# Patient Record
Sex: Female | Born: 1990
Health system: Southern US, Community
[De-identification: ages and names within clinical notes are randomized; demographics above are authoritative.]

## PROBLEM LIST (undated history)

## (undated) DIAGNOSIS — R87629 Unspecified abnormal cytological findings in specimens from vagina: Secondary | ICD-10-CM

## (undated) HISTORY — DX: Unspecified abnormal cytological findings in specimens from vagina: R87.629

## (undated) HISTORY — PX: NO PAST SURGERIES: SHX2092

---

## 2015-04-26 ENCOUNTER — Ambulatory Visit (INDEPENDENT_AMBULATORY_CARE_PROVIDER_SITE_OTHER): Payer: BLUE CROSS/BLUE SHIELD | Admitting: Physician Assistant

## 2015-04-26 VITALS — BP 114/80 | HR 84 | Temp 98.5°F | Resp 18 | Ht 67.0 in | Wt 160.0 lb

## 2015-04-26 DIAGNOSIS — J020 Streptococcal pharyngitis: Secondary | ICD-10-CM

## 2015-04-26 DIAGNOSIS — J029 Acute pharyngitis, unspecified: Secondary | ICD-10-CM | POA: Diagnosis not present

## 2015-04-26 LAB — POCT RAPID STREP A (OFFICE): RAPID STREP A SCREEN: POSITIVE — AB

## 2015-04-26 MED ORDER — AMOXICILLIN 875 MG PO TABS: 875.0000 mg | ORAL_TABLET | Freq: Two times a day (BID) | ORAL | Status: AC

## 2015-04-26 MED ORDER — MAGIC MOUTHWASH W/LIDOCAINE: 10.0000 mL | ORAL | Status: DC | PRN

## 2015-04-26 NOTE — Progress Notes (Signed)
[facility] [address], [place] [id number] [phone]  Date:  04/26/2015   Name:  Kelli BoraBriana Perkins   DOB:  March 21, 1991   MRN:  [id number]  PCP:  No primary care provider on file.    Chief Complaint: Sore Throat   History of Present Illness:  This is a 24 y.o. female who is presenting with sore throat x 2 days.  Cough: no SOB/wheezing: no Nasal congestion: no Otalgia: no Sore throat: yes Fever/chills: no Aggravating/alleviating factors: took ibuprofen and helped some with pain. Hasn't taken yet today. History of asthma: no History of env allergies: no Tobacco use: no States she is prone to strep throat and gets about this time every year. No known contacts with strep. She was around her niece over the holidays who had a cold. Works at [employer].   Review of Systems:  Review of Systems See HPI  There are no active problems to display for this patient.   Prior to Admission medications   Not on File    No Known Allergies  History reviewed. No pertinent past surgical history.  Social History  Substance Use Topics  . Smoking status: Never Smoker   . Smokeless tobacco: None  . Alcohol Use: No    Family History  Problem Relation Age of Onset  . Cancer Father   . Hypertension Father     Medication list has been reviewed and updated.  Physical Examination:  Physical Exam  Constitutional: She is oriented to person, place, and time. She appears well-developed and well-nourished. No distress.  HENT:  Head: Normocephalic and atraumatic.  Right Ear: Hearing, tympanic membrane, external ear and ear canal normal.  Left Ear: Hearing, tympanic membrane, external ear and ear canal normal.  Nose: Nose normal.  Mouth/Throat: Uvula is midline and mucous membranes are normal. Oropharyngeal exudate (left tonsil), posterior oropharyngeal edema and posterior oropharyngeal erythema present. No tonsillar abscesses.  Eyes: Conjunctivae  and lids are normal. Right eye exhibits no discharge. Left eye exhibits no discharge. No scleral icterus.  Cardiovascular: Normal rate, regular rhythm, normal heart sounds and normal pulses.   No murmur heard. Pulmonary/Chest: Effort normal and breath sounds normal. No respiratory distress. She has no wheezes. She has no rhonchi. She has no rales.  Musculoskeletal: Normal range of motion.  Lymphadenopathy:       Head (right side): No submental, no submandibular and no tonsillar adenopathy present.       Head (left side): No submental, no submandibular and no tonsillar adenopathy present.    She has no cervical adenopathy.  Neurological: She is alert and oriented to person, place, and time.  Skin: Skin is warm, dry and intact. No lesion and no rash noted.  Psychiatric: She has a normal mood and affect. Her speech is normal and behavior is normal. Thought content normal.   BP 114/80 mmHg  Pulse 84  Temp(Src) 98.5 F (36.9 C) (Oral)  Resp 18  Ht 5\' 7"  (1.702 m)  Wt 160 lb (72.576 kg)  BMI 25.05 kg/m2  SpO2 98%  LMP 03/15/2015  Results for orders placed or performed in visit on 04/26/15  POCT rapid strep A  Result Value Ref Range   Rapid Strep A Screen Positive (A) Negative    Assessment and Plan:  1. Streptococcal sore throat 2. Sore throat Rapid strep positive. Amox BID x 10 days. Magic mouthwash for pain. Return if not significantly better in 72 hours. - magic mouthwash w/lidocaine  SOLN; Take 10 mLs by mouth every 2 (two) hours as needed for mouth pain.  Dispense: 360 mL; Refill: 0 - amoxicillin (AMOXIL) 875 MG tablet; Take 1 tablet (875 mg total) by mouth 2 (two) times daily.  Dispense: 20 tablet; Refill: 0 - POCT rapid strep A   Kelli Perkins. Kelli Perkins, MHS [facility]  04/26/2015

## 2015-04-26 NOTE — Patient Instructions (Signed)
Take antibiotic twice a day for 10 days. Mouthwash every couple hours as needed, gargle but do not swallow. Ibuprofen/tylenol for pain. Return if symptoms not significantly better in 3 days.

## 2015-04-29 NOTE — Addendum Note (Signed)
Addended by: Carmelina DaneANDERSON, Seham Gardenhire S on: 04/29/2015 05:22 PM   Modules accepted: Kipp BroodSmartSet

## 2015-04-29 NOTE — Progress Notes (Signed)
  Medical screening examination/treatment/procedure(s) were performed by non-physician practitioner and as supervising physician I was immediately available for consultation/collaboration.     

## 2017-08-15 ENCOUNTER — Encounter: Payer: Self-pay | Admitting: Family Medicine

## 2017-08-15 ENCOUNTER — Ambulatory Visit (INDEPENDENT_AMBULATORY_CARE_PROVIDER_SITE_OTHER): Payer: Self-pay | Admitting: General Practice

## 2017-08-15 DIAGNOSIS — Z3201 Encounter for pregnancy test, result positive: Secondary | ICD-10-CM

## 2017-08-15 LAB — POCT PREGNANCY, URINE: Preg Test, Ur: POSITIVE — AB

## 2017-08-15 NOTE — Progress Notes (Signed)
I reviewed the note and agree with the nursing assessment and plan.   Bryar Dahms, CNM 07/27/2017 10:32 AM   

## 2017-08-15 NOTE — Progress Notes (Signed)
Patient here for upt today. UPT +. Patient reports first positive home test yesterday. LMP 07/13/17 EDD 04/19/18 [id number]. Patient denies taking any meds only multivitamin. [facility]. Recommended she follow up with them & begin PNV. Patient verbalized understanding and asked if lifting 15lbs at her job was okay. Told patient that is fine. Patient verbalized understanding & had no questions.

## 2017-08-16 DIAGNOSIS — Z3A01 Less than 8 weeks gestation of pregnancy: Secondary | ICD-10-CM | POA: Diagnosis not present

## 2017-08-16 DIAGNOSIS — O26851 Spotting complicating pregnancy, first trimester: Secondary | ICD-10-CM | POA: Diagnosis not present

## 2017-08-20 DIAGNOSIS — Z3A01 Less than 8 weeks gestation of pregnancy: Secondary | ICD-10-CM | POA: Diagnosis not present

## 2017-08-20 DIAGNOSIS — O26851 Spotting complicating pregnancy, first trimester: Secondary | ICD-10-CM | POA: Diagnosis not present

## 2017-08-20 DIAGNOSIS — O26859 Spotting complicating pregnancy, unspecified trimester: Secondary | ICD-10-CM | POA: Diagnosis not present

## 2017-08-20 DIAGNOSIS — O200 Threatened abortion: Secondary | ICD-10-CM | POA: Diagnosis not present

## 2017-08-20 DIAGNOSIS — Z6829 Body mass index (BMI) 29.0-29.9, adult: Secondary | ICD-10-CM | POA: Diagnosis not present

## 2017-08-22 DIAGNOSIS — O021 Missed abortion: Secondary | ICD-10-CM | POA: Diagnosis not present

## 2017-08-31 DIAGNOSIS — O021 Missed abortion: Secondary | ICD-10-CM | POA: Diagnosis not present

## 2017-11-27 DIAGNOSIS — R87610 Atypical squamous cells of undetermined significance on cytologic smear of cervix (ASC-US): Secondary | ICD-10-CM | POA: Diagnosis not present

## 2017-11-27 DIAGNOSIS — Z113 Encounter for screening for infections with a predominantly sexual mode of transmission: Secondary | ICD-10-CM | POA: Diagnosis not present

## 2017-11-27 DIAGNOSIS — Z01419 Encounter for gynecological examination (general) (routine) without abnormal findings: Secondary | ICD-10-CM | POA: Diagnosis not present

## 2017-11-27 DIAGNOSIS — Z01411 Encounter for gynecological examination (general) (routine) with abnormal findings: Secondary | ICD-10-CM | POA: Diagnosis not present

## 2017-11-27 DIAGNOSIS — Z114 Encounter for screening for human immunodeficiency virus [HIV]: Secondary | ICD-10-CM | POA: Diagnosis not present

## 2018-11-12 DIAGNOSIS — Z03818 Encounter for observation for suspected exposure to other biological agents ruled out: Secondary | ICD-10-CM | POA: Diagnosis not present

## 2018-11-19 DIAGNOSIS — Z3687 Encounter for antenatal screening for uncertain dates: Secondary | ICD-10-CM | POA: Diagnosis not present

## 2018-11-19 DIAGNOSIS — Z3200 Encounter for pregnancy test, result unknown: Secondary | ICD-10-CM | POA: Diagnosis not present

## 2018-11-21 DIAGNOSIS — Z3687 Encounter for antenatal screening for uncertain dates: Secondary | ICD-10-CM | POA: Diagnosis not present

## 2018-11-23 DIAGNOSIS — Z20828 Contact with and (suspected) exposure to other viral communicable diseases: Secondary | ICD-10-CM | POA: Diagnosis not present

## 2018-11-25 DIAGNOSIS — Z3687 Encounter for antenatal screening for uncertain dates: Secondary | ICD-10-CM | POA: Diagnosis not present

## 2018-11-29 ENCOUNTER — Encounter (HOSPITAL_COMMUNITY): Payer: Self-pay | Admitting: Emergency Medicine

## 2018-11-29 ENCOUNTER — Emergency Department (HOSPITAL_COMMUNITY)
Admission: EM | Admit: 2018-11-29 | Discharge: 2018-11-29 | Disposition: A | Discharge status: Home / Self Care | Payer: BC Managed Care – PPO | Attending: Emergency Medicine | Admitting: Emergency Medicine

## 2018-11-29 ENCOUNTER — Other Ambulatory Visit: Payer: Self-pay

## 2018-11-29 DIAGNOSIS — N939 Abnormal uterine and vaginal bleeding, unspecified: Secondary | ICD-10-CM | POA: Insufficient documentation

## 2018-11-29 DIAGNOSIS — Z3A01 Less than 8 weeks gestation of pregnancy: Secondary | ICD-10-CM | POA: Diagnosis not present

## 2018-11-29 DIAGNOSIS — O209 Hemorrhage in early pregnancy, unspecified: Secondary | ICD-10-CM | POA: Diagnosis not present

## 2018-11-29 DIAGNOSIS — Z5321 Procedure and treatment not carried out due to patient leaving prior to being seen by health care provider: Secondary | ICD-10-CM | POA: Diagnosis not present

## 2018-11-29 DIAGNOSIS — N898 Other specified noninflammatory disorders of vagina: Secondary | ICD-10-CM | POA: Diagnosis not present

## 2018-11-29 NOTE — ED Notes (Signed)
Called patient in lobby to collect labs and no one responded

## 2018-12-13 DIAGNOSIS — Z3201 Encounter for pregnancy test, result positive: Secondary | ICD-10-CM | POA: Diagnosis not present

## 2018-12-20 DIAGNOSIS — Z3481 Encounter for supervision of other normal pregnancy, first trimester: Secondary | ICD-10-CM | POA: Diagnosis not present

## 2018-12-20 LAB — OB RESULTS CONSOLE GC/CHLAMYDIA
Chlamydia: NEGATIVE
Gonorrhea: NEGATIVE

## 2018-12-20 LAB — OB RESULTS CONSOLE RUBELLA ANTIBODY, IGM: Rubella: IMMUNE

## 2018-12-20 LAB — OB RESULTS CONSOLE HEPATITIS B SURFACE ANTIGEN: Hepatitis B Surface Ag: NEGATIVE

## 2018-12-20 LAB — OB RESULTS CONSOLE HIV ANTIBODY (ROUTINE TESTING): HIV: NONREACTIVE

## 2018-12-20 LAB — OB RESULTS CONSOLE RPR: RPR: NONREACTIVE

## 2018-12-25 ENCOUNTER — Other Ambulatory Visit: Payer: Self-pay

## 2018-12-25 ENCOUNTER — Inpatient Hospital Stay (HOSPITAL_COMMUNITY)
Admission: AD | Admit: 2018-12-25 | Discharge: 2018-12-25 | Disposition: A | Discharge status: Home / Self Care | Payer: BC Managed Care – PPO | Attending: Obstetrics & Gynecology | Admitting: Obstetrics & Gynecology

## 2018-12-25 ENCOUNTER — Encounter (HOSPITAL_COMMUNITY): Payer: Self-pay

## 2018-12-25 ENCOUNTER — Inpatient Hospital Stay (HOSPITAL_COMMUNITY): Payer: BC Managed Care – PPO

## 2018-12-25 DIAGNOSIS — O209 Hemorrhage in early pregnancy, unspecified: Secondary | ICD-10-CM | POA: Diagnosis not present

## 2018-12-25 DIAGNOSIS — O208 Other hemorrhage in early pregnancy: Secondary | ICD-10-CM

## 2018-12-25 DIAGNOSIS — Z3A09 9 weeks gestation of pregnancy: Secondary | ICD-10-CM | POA: Diagnosis not present

## 2018-12-25 DIAGNOSIS — O26859 Spotting complicating pregnancy, unspecified trimester: Secondary | ICD-10-CM | POA: Diagnosis not present

## 2018-12-25 DIAGNOSIS — Z79899 Other long term (current) drug therapy: Secondary | ICD-10-CM | POA: Diagnosis not present

## 2018-12-25 LAB — URINALYSIS, ROUTINE W REFLEX MICROSCOPIC
Bilirubin Urine: NEGATIVE
Glucose, UA: NEGATIVE mg/dL
Hgb urine dipstick: NEGATIVE
Ketones, ur: 5 mg/dL — AB
Leukocytes,Ua: NEGATIVE
Nitrite: NEGATIVE
Protein, ur: 30 mg/dL — AB
Specific Gravity, Urine: 1.03 (ref 1.005–1.030)
pH: 5 (ref 5.0–8.0)

## 2018-12-25 LAB — WET PREP, GENITAL
Clue Cells Wet Prep HPF POC: NONE SEEN
Sperm: NONE SEEN
Trich, Wet Prep: NONE SEEN
Yeast Wet Prep HPF POC: NONE SEEN

## 2018-12-25 LAB — ABO/RH: ABO/RH(D): O POS

## 2018-12-25 LAB — POCT PREGNANCY, URINE: Preg Test, Ur: POSITIVE — AB

## 2018-12-25 NOTE — MAU Note (Signed)
Got off work at [employer] and felt a gush of fluid so she went to the restroom and noticed it was blood.  Reports she is [redacted]w[redacted]d pregnancy.  LMP 10/19/18.  States she was seen at [facility] and had an u/s at 8 wk and FHR was seen.

## 2018-12-25 NOTE — MAU Provider Note (Signed)
History     CSN: [id number]  Arrival date and time: 12/25/18 2021   First Provider Initiated Contact with Patient 12/25/18 2054      Chief Complaint  Patient presents with  . Vaginal Bleeding   Kelli Perkins is a 28 y.o. G2P0010 at 2344w4d who [facility].  She presents today for Vaginal Bleeding that she noticed about one hour ago.  She states she went to the bathroom, after feeling a gush, and noticed bright red blood in her underwear.  She denies clots and pain or abdominal cramping.  She reports that she was working prior to the incident and does at lot of standing at her job.  Patient reports that she has an US last week that showed a SIUP at approximately 8 weeks.       OB History    Gravida  2   Para      Term      Preterm      AB  1   Living        SAB  1   TAB      Ectopic      Multiple      Live Births              History reviewed. No pertinent past medical history.  History reviewed. No pertinent surgical history.  Family History  Problem Relation Age of Onset  . Cancer Father   . Hypertension Father     Social History   Tobacco Use  . Smoking status: Never Smoker  Substance Use Topics  . Alcohol use: No    Alcohol/week: 0.0 standard drinks  . Drug use: No    Allergies: No Known Allergies  Medications Prior to Admission  Medication Sig Dispense Refill Last Dose  . Doxylamine-Pyridoxine 10-10 MG TBEC Take by mouth.   12/25/2018 at Unknown time  . Prenatal Vit-Fe Fumarate-FA (PRENATAL MULTIVITAMIN) TABS tablet Take 1 tablet by mouth daily at 12 noon.   12/25/2018 at Unknown time  . magic mouthwash w/lidocaine SOLN Take 10 mLs by mouth every 2 (two) hours as needed for mouth pain. 360 mL 0     Review of Systems  Constitutional: Negative for chills and fever.  Respiratory: Negative for cough and shortness of breath.   Gastrointestinal: Positive for constipation (2 days ago; Hard to pass), nausea and vomiting.  Negative for abdominal pain and diarrhea.  Genitourinary: Positive for vaginal bleeding. Negative for difficulty urinating, dysuria and vaginal discharge.  Neurological: Negative for dizziness, light-headedness and headaches.   Physical Exam   Blood pressure 124/79, pulse 99, temperature 98.7 F (37.1 C), resp. rate 17, weight 88.2 kg, last menstrual period 10/19/2018.  Physical Exam  Constitutional: She is oriented to person, place, and time. She appears well-developed and well-nourished.  HENT:  Head: Normocephalic and atraumatic.  Eyes: Conjunctivae are normal.  Neck: Normal range of motion.  Cardiovascular: Normal rate, regular rhythm and normal heart sounds.  Respiratory: Effort normal and breath sounds normal.  GI: Soft. Bowel sounds are normal.  Genitourinary: Cervix exhibits no motion tenderness and no discharge.    Vaginal bleeding present.     No vaginal discharge.  There is bleeding in the vagina.    Genitourinary Comments:  Speculum Exam: -Normal External Genitalia: Non tender, no apparent discharge or blood at introitus.  -Vaginal Vault: Pink mucosa with good rugae. Scant amt blood noted with quarter sized clot removed from posterior fornix  -wet prep  collected -Cervix:Pink, no lesions, cysts, or polyps.  Appears closed. No active bleeding or discharge from os-GC/CT collected -Bimanual Exam:  No tenderness in cul de sac Uterus difficult to assess.    Musculoskeletal: Normal range of motion.        General: No edema.  Neurological: She is alert and oriented to person, place, and time.  Skin: Skin is warm and dry.  Psychiatric: She has a normal mood and affect. Her behavior is normal.    MAU Course  Procedures Results for orders placed or performed during the hospital encounter of 12/25/18 (from the past 24 hour(s))  Pregnancy, urine POC     Status: Abnormal   Collection Time: 12/25/18  8:39 PM  Result Value Ref Range   Preg Test, Ur POSITIVE (A) NEGATIVE     MDM Pelvic Exam; Wet Prep and GC/CT Labs: UA, UPT, ABO/RH Ultrasound Assessment and Plan  28 year old G2P0010 SIUP at 9.4 weeks Vaginal Bleeding  -Exam findings discussed. -Patient expresses concern about vaginal bleeding and reassured that will evaluate accordingly. -Cultures collected. -Will send for [place]. -Results pending.   Maryann Conners MSN, CNM 12/25/2018, 8:55 PM   Reassessment (10:07 PM) SIUP at 9.1 weeks No SCH  -[place] results discussed with patient who was ecstatic! -Patient expresses gratitude and relief. -Discussed normalcy of some vaginal bleeding in first trimester. -Reviewed wet prep and informed that GC/CT pending.  -No Blood type on file.  Patient suspects she is O positive, but is unsure.  Will collect immediately.  Reassessment (10:48 PM) O Positive  -Nurse instructed to inform patient of blood type. -Encouraged to call or return to MAU if symptoms worsen or with the onset of new symptoms. -Discharged to home in stable condition.  Maryann Conners MSN, CNM 12/25/2018 10:54 PM

## 2018-12-25 NOTE — Discharge Instructions (Signed)
First Trimester of Pregnancy °The first trimester of pregnancy is from week 1 until the end of week 13 (months 1 through 3). A week after a sperm fertilizes an egg, the egg will implant on the wall of the uterus. This embryo will begin to develop into a baby. Genes from you and your partner will form the baby. The female genes will determine whether the baby will be a boy or a girl. At 6-8 weeks, the eyes and face will be formed, and the heartbeat can be seen on ultrasound. At the end of 12 weeks, all the baby's organs will be formed. °Now that you are pregnant, you will want to do everything you can to have a healthy baby. Two of the most important things are to get good prenatal care and to follow your health care provider's instructions. Prenatal care is all the medical care you receive before the baby's birth. This care will help prevent, find, and treat any problems during the pregnancy and childbirth. °Body changes during your first trimester °Your body goes through many changes during pregnancy. The changes vary from woman to woman. °· You may gain or lose a couple of pounds at first. °· You may feel sick to your stomach (nauseous) and you may throw up (vomit). If the vomiting is uncontrollable, call your health care provider. °· You may tire easily. °· You may develop headaches that can be relieved by medicines. All medicines should be approved by your health care provider. °· You may urinate more often. Painful urination may mean you have a bladder infection. °· You may develop heartburn as a result of your pregnancy. °· You may develop constipation because certain hormones are causing the muscles that push stool through your intestines to slow down. °· You may develop hemorrhoids or swollen veins (varicose veins). °· Your breasts may begin to grow larger and become tender. Your nipples may stick out more, and the tissue that surrounds them (areola) may become darker. °· Your gums may bleed and may be  sensitive to brushing and flossing. °· Dark spots or blotches (chloasma, mask of pregnancy) may develop on your face. This will likely fade after the baby is born. °· Your menstrual periods will stop. °· You may have a loss of appetite. °· You may develop cravings for certain kinds of food. °· You may have changes in your emotions from day to day, such as being excited to be pregnant or being concerned that something may go wrong with the pregnancy and baby. °· You may have more vivid and strange dreams. °· You may have changes in your hair. These can include thickening of your hair, rapid growth, and changes in texture. Some women also have hair loss during or after pregnancy, or hair that feels dry or thin. Your hair will most likely return to normal after your baby is born. °What to expect at prenatal visits °During a routine prenatal visit: °· You will be weighed to make sure you and the baby are growing normally. °· Your blood pressure will be taken. °· Your abdomen will be measured to track your baby's growth. °· The fetal heartbeat will be listened to between weeks 10 and 14 of your pregnancy. °· Test results from any previous visits will be discussed. °Your health care provider may ask you: °· How you are feeling. °· If you are feeling the baby move. °· If you have had any abnormal symptoms, such as leaking fluid, bleeding, severe headaches, or abdominal   cramping. °· If you are using any tobacco products, including cigarettes, chewing tobacco, and electronic cigarettes. °· If you have any questions. °Other tests that may be performed during your first trimester include: °· Blood tests to find your blood type and to check for the presence of any previous infections. The tests will also be used to check for low iron levels (anemia) and protein on red blood cells (Rh antibodies). Depending on your risk factors, or if you previously had diabetes during pregnancy, you may have tests to check for high blood sugar  that affects pregnant women (gestational diabetes). °· Urine tests to check for infections, diabetes, or protein in the urine. °· An ultrasound to confirm the proper growth and development of the baby. °· Fetal screens for spinal cord problems (spina bifida) and Down syndrome. °· HIV (human immunodeficiency virus) testing. Routine prenatal testing includes screening for HIV, unless you choose not to have this test. °· You may need other tests to make sure you and the baby are doing well. °Follow these instructions at home: °Medicines °· Follow your health care provider's instructions regarding medicine use. Specific medicines may be either safe or unsafe to take during pregnancy. °· Take a prenatal vitamin that contains at least 600 micrograms (mcg) of folic acid. °· If you develop constipation, try taking a stool softener if your health care provider approves. °Eating and drinking ° °· Eat a balanced diet that includes fresh fruits and vegetables, whole grains, good sources of protein such as meat, eggs, or tofu, and low-fat dairy. Your health care provider will help you determine the amount of weight gain that is right for you. °· Avoid raw meat and uncooked cheese. These carry germs that can cause birth defects in the baby. °· Eating four or five small meals rather than three large meals a day may help relieve nausea and vomiting. If you start to feel nauseous, eating a few soda crackers can be helpful. Drinking liquids between meals, instead of during meals, also seems to help ease nausea and vomiting. °· Limit foods that are high in fat and processed sugars, such as fried and sweet foods. °· To prevent constipation: °? Eat foods that are high in fiber, such as fresh fruits and vegetables, whole grains, and beans. °? Drink enough fluid to keep your urine clear or pale yellow. °Activity °· Exercise only as directed by your health care provider. Most women can continue their usual exercise routine during  pregnancy. Try to exercise for 30 minutes at least 5 days a week. Exercising will help you: °? Control your weight. °? Stay in shape. °? Be prepared for labor and delivery. °· Experiencing pain or cramping in the lower abdomen or lower back is a good sign that you should stop exercising. Check with your health care provider before continuing with normal exercises. °· Try to avoid standing for long periods of time. Move your legs often if you must stand in one place for a long time. °· Avoid heavy lifting. °· Wear low-heeled shoes and practice good posture. °· You may continue to have sex unless your health care provider tells you not to. °Relieving pain and discomfort °· Wear a good support bra to relieve breast tenderness. °· Take warm sitz baths to soothe any pain or discomfort caused by hemorrhoids. Use hemorrhoid cream if your health care provider approves. °· Rest with your legs elevated if you have leg cramps or low back pain. °· If you develop varicose veins in   your legs, wear support hose. Elevate your feet for 15 minutes, 3-4 times a day. Limit salt in your diet. Prenatal care  Schedule your prenatal visits by the twelfth week of pregnancy. They are usually scheduled monthly at first, then more often in the last 2 months before delivery.  Write down your questions. Take them to your prenatal visits.  Keep all your prenatal visits as told by your health care provider. This is important. Safety  Wear your seat belt at all times when driving.  Make a list of emergency phone numbers, including numbers for family, friends, the hospital, and police and fire departments. General instructions  Ask your health care provider for a referral to a local prenatal education class. Begin classes no later than the beginning of month 6 of your pregnancy.  Ask for help if you have counseling or nutritional needs during pregnancy. Your health care provider can offer advice or refer you to specialists for help  with various needs.  Do not use hot tubs, steam rooms, or saunas.  Do not douche or use tampons or scented sanitary pads.  Do not cross your legs for long periods of time.  Avoid cat litter boxes and soil used by cats. These carry germs that can cause birth defects in the baby and possibly loss of the fetus by miscarriage or stillbirth.  Avoid all smoking, herbs, alcohol, and medicines not prescribed by your health care provider. Chemicals in these products affect the formation and growth of the baby.  Do not use any products that contain nicotine or tobacco, such as cigarettes and e-cigarettes. If you need help quitting, ask your health care provider. You may receive counseling support and other resources to help you quit.  Schedule a dentist appointment. At home, brush your teeth with a soft toothbrush and be gentle when you floss. Contact a health care provider if:  You have dizziness.  You have mild pelvic cramps, pelvic pressure, or nagging pain in the abdominal area.  You have persistent nausea, vomiting, or diarrhea.  You have a bad smelling vaginal discharge.  You have pain when you urinate.  You notice increased swelling in your face, hands, legs, or ankles.  You are exposed to fifth disease or chickenpox.  You are exposed to [place] measles (rubella) and have never had it. Get help right away if:  You have a fever.  You are leaking fluid from your vagina.  You have spotting or bleeding from your vagina.  You have severe abdominal cramping or pain.  You have rapid weight gain or loss.  You vomit blood or material that looks like coffee grounds.  You develop a severe headache.  You have shortness of breath.  You have any kind of trauma, such as from a fall or a car accident. Summary  The first trimester of pregnancy is from week 1 until the end of week 13 (months 1 through 3).  Your body goes through many changes during pregnancy. The changes vary from  woman to woman.  You will have routine prenatal visits. During those visits, your health care provider will examine you, discuss any test results you may have, and talk with you about how you are feeling. This information is not intended to replace advice given to you by your health care provider. Make sure you discuss any questions you have with your health care provider. Document Released: 04/11/2001 Document Revised: 03/30/2017 Document Reviewed: 03/29/2016 Elsevier Patient Education  [place]. Vaginal Bleeding During  Pregnancy, First Trimester  A small amount of bleeding from the vagina (spotting) is relatively common during early pregnancy. It usually stops on its own. Various things may cause bleeding or spotting during early pregnancy. Some bleeding may be related to the pregnancy, and some may not. In many cases, the bleeding is normal and is not a problem. However, bleeding can also be a sign of something serious. Be sure to tell your health care provider about any vaginal bleeding right away. Some possible causes of vaginal bleeding during the first trimester include:  Infection or inflammation of the cervix.  Growths (polyps) on the cervix.  Miscarriage or threatened miscarriage.  Pregnancy tissue developing outside of the uterus (ectopic pregnancy).  A mass of tissue developing in the uterus due to an egg being fertilized incorrectly (molar pregnancy). Follow these instructions at home: Activity  Follow instructions from your health care provider about limiting your activity. Ask what activities are safe for you.  If needed, make plans for someone to help with your regular activities.  Do not have sex or orgasms until your health care provider says that this is safe. General instructions  Take over-the-counter and prescription medicines only as told by your health care provider.  Pay attention to any changes in your symptoms.  Do not use tampons or  douche.  Write down how many pads you use each day, how often you change pads, and how soaked (saturated) they are.  If you pass any tissue from your vagina, save the tissue so you can show it to your health care provider.  Keep all follow-up visits as told by your health care provider. This is important. Contact a health care provider if:  You have vaginal bleeding during any part of your pregnancy.  You have cramps or labor pains.  You have a fever. Get help right away if:  You have severe cramps in your back or abdomen.  You pass large clots or a large amount of tissue from your vagina.  Your bleeding increases.  You feel light-headed or weak, or you faint.  You have chills.  You are leaking fluid or have a gush of fluid from your vagina. Summary  A small amount of bleeding (spotting) from the vagina is relatively common during early pregnancy.  Various things may cause bleeding or spotting in early pregnancy.  Be sure to tell your health care provider about any vaginal bleeding right away. This information is not intended to replace advice given to you by your health care provider. Make sure you discuss any questions you have with your health care provider. Document Released: 01/25/2005 Document Revised: 08/06/2018 Document Reviewed: 07/20/2016 Elsevier Patient Education  2020 [employer]

## 2018-12-27 DIAGNOSIS — Z3481 Encounter for supervision of other normal pregnancy, first trimester: Secondary | ICD-10-CM | POA: Diagnosis not present

## 2018-12-27 DIAGNOSIS — Z3A09 9 weeks gestation of pregnancy: Secondary | ICD-10-CM | POA: Diagnosis not present

## 2018-12-27 DIAGNOSIS — O209 Hemorrhage in early pregnancy, unspecified: Secondary | ICD-10-CM | POA: Diagnosis not present

## 2018-12-27 DIAGNOSIS — Z3682 Encounter for antenatal screening for nuchal translucency: Secondary | ICD-10-CM | POA: Diagnosis not present

## 2018-12-27 LAB — GC/CHLAMYDIA PROBE AMP (~~LOC~~) NOT AT ARMC
Chlamydia: NEGATIVE
Neisseria Gonorrhea: NEGATIVE

## 2018-12-31 DIAGNOSIS — O209 Hemorrhage in early pregnancy, unspecified: Secondary | ICD-10-CM | POA: Diagnosis not present

## 2018-12-31 DIAGNOSIS — Z3A10 10 weeks gestation of pregnancy: Secondary | ICD-10-CM | POA: Diagnosis not present

## 2019-01-09 DIAGNOSIS — O209 Hemorrhage in early pregnancy, unspecified: Secondary | ICD-10-CM | POA: Diagnosis not present

## 2019-01-09 DIAGNOSIS — Z3A11 11 weeks gestation of pregnancy: Secondary | ICD-10-CM | POA: Diagnosis not present

## 2019-01-17 DIAGNOSIS — Z3A12 12 weeks gestation of pregnancy: Secondary | ICD-10-CM | POA: Diagnosis not present

## 2019-01-17 DIAGNOSIS — O209 Hemorrhage in early pregnancy, unspecified: Secondary | ICD-10-CM | POA: Diagnosis not present

## 2019-01-17 DIAGNOSIS — Z3682 Encounter for antenatal screening for nuchal translucency: Secondary | ICD-10-CM | POA: Diagnosis not present

## 2019-01-31 DIAGNOSIS — O26872 Cervical shortening, second trimester: Secondary | ICD-10-CM | POA: Diagnosis not present

## 2019-01-31 DIAGNOSIS — O09292 Supervision of pregnancy with other poor reproductive or obstetric history, second trimester: Secondary | ICD-10-CM | POA: Diagnosis not present

## 2019-01-31 DIAGNOSIS — Z3A14 14 weeks gestation of pregnancy: Secondary | ICD-10-CM | POA: Diagnosis not present

## 2019-02-04 DIAGNOSIS — Z03818 Encounter for observation for suspected exposure to other biological agents ruled out: Secondary | ICD-10-CM | POA: Diagnosis not present

## 2019-02-04 DIAGNOSIS — Z20828 Contact with and (suspected) exposure to other viral communicable diseases: Secondary | ICD-10-CM | POA: Diagnosis not present

## 2019-02-14 DIAGNOSIS — Z361 Encounter for antenatal screening for raised alphafetoprotein level: Secondary | ICD-10-CM | POA: Diagnosis not present

## 2019-02-14 DIAGNOSIS — O26872 Cervical shortening, second trimester: Secondary | ICD-10-CM | POA: Diagnosis not present

## 2019-02-14 DIAGNOSIS — O09292 Supervision of pregnancy with other poor reproductive or obstetric history, second trimester: Secondary | ICD-10-CM | POA: Diagnosis not present

## 2019-02-14 DIAGNOSIS — Z3A16 16 weeks gestation of pregnancy: Secondary | ICD-10-CM | POA: Diagnosis not present

## 2019-02-19 DIAGNOSIS — O24410 Gestational diabetes mellitus in pregnancy, diet controlled: Secondary | ICD-10-CM | POA: Diagnosis not present

## 2019-02-19 DIAGNOSIS — O3432 Maternal care for cervical incompetence, second trimester: Secondary | ICD-10-CM | POA: Diagnosis not present

## 2019-02-19 DIAGNOSIS — Z3A17 17 weeks gestation of pregnancy: Secondary | ICD-10-CM | POA: Diagnosis not present

## 2019-02-19 DIAGNOSIS — O26872 Cervical shortening, second trimester: Secondary | ICD-10-CM | POA: Diagnosis not present

## 2019-02-19 DIAGNOSIS — O09292 Supervision of pregnancy with other poor reproductive or obstetric history, second trimester: Secondary | ICD-10-CM | POA: Diagnosis not present

## 2019-02-21 ENCOUNTER — Other Ambulatory Visit (HOSPITAL_COMMUNITY): Payer: Self-pay | Admitting: Obstetrics and Gynecology

## 2019-02-21 DIAGNOSIS — Z3A18 18 weeks gestation of pregnancy: Secondary | ICD-10-CM

## 2019-02-21 DIAGNOSIS — Z3686 Encounter for antenatal screening for cervical length: Secondary | ICD-10-CM

## 2019-02-24 DIAGNOSIS — Z20828 Contact with and (suspected) exposure to other viral communicable diseases: Secondary | ICD-10-CM | POA: Diagnosis not present

## 2019-02-26 ENCOUNTER — Other Ambulatory Visit: Payer: Self-pay

## 2019-02-26 ENCOUNTER — Encounter (HOSPITAL_COMMUNITY): Payer: Self-pay | Admitting: *Deleted

## 2019-02-26 ENCOUNTER — Ambulatory Visit (HOSPITAL_COMMUNITY)
Admission: RE | Admit: 2019-02-26 | Discharge: 2019-02-26 | Disposition: A | Discharge status: Home / Self Care | Payer: BC Managed Care – PPO | Source: Ambulatory Visit | Attending: Obstetrics and Gynecology | Admitting: Obstetrics and Gynecology

## 2019-02-26 ENCOUNTER — Ambulatory Visit (HOSPITAL_BASED_OUTPATIENT_CLINIC_OR_DEPARTMENT_OTHER): Payer: BC Managed Care – PPO | Admitting: *Deleted

## 2019-02-26 VITALS — BP 127/77 | HR 91 | Temp 98.7°F

## 2019-02-26 DIAGNOSIS — O26872 Cervical shortening, second trimester: Secondary | ICD-10-CM

## 2019-02-26 DIAGNOSIS — Z3A18 18 weeks gestation of pregnancy: Secondary | ICD-10-CM

## 2019-02-26 DIAGNOSIS — O26879 Cervical shortening, unspecified trimester: Secondary | ICD-10-CM | POA: Diagnosis not present

## 2019-02-26 DIAGNOSIS — Z3686 Encounter for antenatal screening for cervical length: Secondary | ICD-10-CM | POA: Diagnosis not present

## 2019-02-26 DIAGNOSIS — O3432 Maternal care for cervical incompetence, second trimester: Secondary | ICD-10-CM | POA: Diagnosis not present

## 2019-02-27 NOTE — Progress Notes (Signed)
This patient was seen in consultation due to a shortened cervix with funneling noted on a recent ultrasound performed in your office.  The patient reports one prior miscarriage at around 6 weeks.  She denies any history of a prior preterm birth.  She also denies any prior surgeries to her cervix.  She reports that a digital exam performed in your office revealed that her cervix is firm and closed.  She denies feeling any lower abdominal cramping or contractions.  Due to the sonographically detected shortened cervix, the patient reports that she is currently treated with daily vaginal progesterone.  A viable intrauterine pregnancy was noted today.    A transvaginal ultrasound performed today revealed a cervical length of 1.36 cm long with funneling. There still appears to be a decent amount of cervical tissue surrounding the funneled cervix.  The increased risk of a preterm birth due to her shortened and funneled cervix was discussed with the patient.  She was advised that as she does not have a history of a prior preterm birth, an incidentally detected shortened cervix on a second trimester ultrasound is not an indication for a cervical cerclage.  The recommended treatment for asymptomatic women without a prior preterm birth with a sonographically detected shortened cervix is daily vaginal progesterone.  She was advised that vaginal progesterone has been shown to decrease the risk of a preterm birth in a woman with a sonographically detected shortened cervix.    The patient was advised to continue using the Prometrium that has been prescribed for her.  The patient stated that she is concerned that she may not be putting the Prometrium tablets high enough into her vagina as there is no applicator.  She was advised to contact her insurance company to determine if Crinone 8% gel, which has an applicator would be covered.  If Crinone 8% gel is covered, she may be switched over to that form of vaginal  progesterone.  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  Due to her sonographically detected shortened cervix, in an attempt to try to improve her pregnancy outcome, I would recommend that she be taken out of work and be placed on home modified bedrest until she reaches a more optimal gestational age.   The patient should continue to be followed with serial cervical length measurements.  Should progressive cervical shortening be noted during her future exams, prolonged hospitalization until she reaches a more optimal gestational age may be considered.  She understands that due to her sonographically detected shortened cervix, that despite all medical treatment, she still may have an unsuccessful pregnancy outcome.  The patient stated that all of her questions had been answered to her complete satisfaction.  Thank you for referring this very nice patient for a Maternal-Fetal Medicine consultation.  A total of 30 minutes was spent counseling and coordinating the care for this patient.  Greater than 50% of the time was spent in direct face-to-face contact.  Addendum:02/27/2019 I received a call from the nurse in your office who stated that the patient called this morning asking that a cervical cerclage be placed for her.  Although I had advised the patient to be treated with daily vaginal progesterone during our consultation yesterday, I do not believe that the cerclage would do any harm.  Therefore, if the patient wants a cerclage, she may have one placed.  As Dr. Ronita Hipps is unavailable this week,  the patient may have the cervical cerclage placed sometime next week.  The risks versus benefits of a cerclage was discussed during our consultation yesterday.  She was also advised that I have seen women have a preterm birth despite having the cerclage placed.

## 2019-03-03 DIAGNOSIS — O3433 Maternal care for cervical incompetence, third trimester: Secondary | ICD-10-CM | POA: Diagnosis not present

## 2019-03-03 DIAGNOSIS — O24410 Gestational diabetes mellitus in pregnancy, diet controlled: Secondary | ICD-10-CM | POA: Diagnosis not present

## 2019-03-03 DIAGNOSIS — Z363 Encounter for antenatal screening for malformations: Secondary | ICD-10-CM | POA: Diagnosis not present

## 2019-03-03 DIAGNOSIS — O26872 Cervical shortening, second trimester: Secondary | ICD-10-CM | POA: Diagnosis not present

## 2019-03-03 DIAGNOSIS — Z3A19 19 weeks gestation of pregnancy: Secondary | ICD-10-CM | POA: Diagnosis not present

## 2019-03-05 ENCOUNTER — Other Ambulatory Visit: Payer: Self-pay | Admitting: Obstetrics and Gynecology

## 2019-03-06 ENCOUNTER — Encounter (HOSPITAL_COMMUNITY): Payer: Self-pay | Admitting: Obstetrics and Gynecology

## 2019-03-06 ENCOUNTER — Other Ambulatory Visit: Payer: Self-pay

## 2019-03-06 ENCOUNTER — Encounter (HOSPITAL_COMMUNITY): Payer: Self-pay

## 2019-03-06 ENCOUNTER — Other Ambulatory Visit (HOSPITAL_COMMUNITY): Payer: Self-pay | Admitting: Obstetrics and Gynecology

## 2019-03-06 ENCOUNTER — Other Ambulatory Visit (HOSPITAL_COMMUNITY)
Admission: RE | Admit: 2019-03-06 | Discharge: 2019-03-06 | Disposition: A | Discharge status: Home / Self Care | Payer: BC Managed Care – PPO | Source: Ambulatory Visit | Attending: Obstetrics and Gynecology | Admitting: Obstetrics and Gynecology

## 2019-03-06 DIAGNOSIS — Z20828 Contact with and (suspected) exposure to other viral communicable diseases: Secondary | ICD-10-CM | POA: Diagnosis not present

## 2019-03-06 DIAGNOSIS — Z01812 Encounter for preprocedural laboratory examination: Secondary | ICD-10-CM | POA: Insufficient documentation

## 2019-03-06 LAB — CBC
HCT: 35.5 % — ABNORMAL LOW (ref 36.0–46.0)
Hemoglobin: 11.6 g/dL — ABNORMAL LOW (ref 12.0–15.0)
MCH: 29 pg (ref 26.0–34.0)
MCHC: 32.7 g/dL (ref 30.0–36.0)
MCV: 88.8 fL (ref 80.0–100.0)
Platelets: 248 10*3/uL (ref 150–400)
RBC: 4 MIL/uL (ref 3.87–5.11)
RDW: 13 % (ref 11.5–15.5)
WBC: 10.2 10*3/uL (ref 4.0–10.5)
nRBC: 0 % (ref 0.0–0.2)

## 2019-03-06 LAB — RPR: RPR Ser Ql: NONREACTIVE

## 2019-03-06 LAB — TYPE AND SCREEN
ABO/RH(D): O POS
Antibody Screen: NEGATIVE

## 2019-03-06 LAB — SARS CORONAVIRUS 2 (TAT 6-24 HRS): SARS Coronavirus 2: NEGATIVE

## 2019-03-06 NOTE — MAU Note (Signed)
Covid swab collected.Pt tolerated well. Pt asymptomatic. Pt waiting for lab to collect CBC/ Type and screen

## 2019-03-07 ENCOUNTER — Encounter (HOSPITAL_COMMUNITY)
Admission: RE | Disposition: A | Discharge status: Home / Self Care | Payer: Self-pay | Source: Home / Self Care | Attending: Obstetrics and Gynecology

## 2019-03-07 ENCOUNTER — Observation Stay (HOSPITAL_COMMUNITY): Payer: BC Managed Care – PPO | Admitting: Certified Registered Nurse Anesthetist

## 2019-03-07 ENCOUNTER — Encounter (HOSPITAL_COMMUNITY): Payer: Self-pay | Admitting: *Deleted

## 2019-03-07 ENCOUNTER — Observation Stay (HOSPITAL_COMMUNITY)
Admission: RE | Admit: 2019-03-07 | Discharge: 2019-03-07 | Disposition: A | Discharge status: Home / Self Care | Payer: BC Managed Care – PPO | Attending: Obstetrics and Gynecology | Admitting: Obstetrics and Gynecology

## 2019-03-07 DIAGNOSIS — O3432 Maternal care for cervical incompetence, second trimester: Secondary | ICD-10-CM | POA: Diagnosis not present

## 2019-03-07 DIAGNOSIS — Z3A19 19 weeks gestation of pregnancy: Secondary | ICD-10-CM | POA: Insufficient documentation

## 2019-03-07 DIAGNOSIS — Z3A20 20 weeks gestation of pregnancy: Secondary | ICD-10-CM | POA: Diagnosis not present

## 2019-03-07 DIAGNOSIS — O26872 Cervical shortening, second trimester: Secondary | ICD-10-CM | POA: Diagnosis not present

## 2019-03-07 HISTORY — PX: CERVICAL CERCLAGE: SHX1329

## 2019-03-07 SURGERY — CERCLAGE, CERVIX, VAGINAL APPROACH
Anesthesia: Spinal | Site: Vagina | Wound class: Clean Contaminated

## 2019-03-07 MED ORDER — ONDANSETRON HCL 4 MG/2ML IJ SOLN
INTRAMUSCULAR | Status: AC
  Filled 2019-03-07: qty 2

## 2019-03-07 MED ORDER — LACTATED RINGERS IV SOLN
INTRAVENOUS | Status: DC
  Administered 2019-03-07 (×2): via INTRAVENOUS

## 2019-03-07 MED ORDER — ONDANSETRON HCL 4 MG/2ML IJ SOLN
INTRAMUSCULAR | Status: DC | PRN
  Administered 2019-03-07: 4 mg via INTRAVENOUS

## 2019-03-07 MED ORDER — MEPERIDINE HCL 25 MG/ML IJ SOLN: 6.2500 mg | INTRAMUSCULAR | Status: DC | PRN

## 2019-03-07 MED ORDER — INDOMETHACIN 50 MG PO CAPS: 50.0000 mg | ORAL_CAPSULE | Freq: Four times a day (QID) | ORAL | 0 refills | Status: AC

## 2019-03-07 MED ORDER — CEFAZOLIN SODIUM-DEXTROSE 2-4 GM/100ML-% IV SOLN
2.0000 g | INTRAVENOUS | Status: AC
  Administered 2019-03-07: 2 g via INTRAVENOUS

## 2019-03-07 MED ORDER — BUPIVACAINE IN DEXTROSE 0.75-8.25 % IT SOLN
INTRATHECAL | Status: DC | PRN
  Administered 2019-03-07: 6 mg via INTRATHECAL

## 2019-03-07 MED ORDER — SODIUM CHLORIDE 0.9 % IR SOLN
Status: DC | PRN
  Administered 2019-03-07: 1

## 2019-03-07 MED ORDER — CEFAZOLIN SODIUM-DEXTROSE 2-4 GM/100ML-% IV SOLN
INTRAVENOUS | Status: AC
  Filled 2019-03-07: qty 100

## 2019-03-07 MED ORDER — FENTANYL CITRATE (PF) 100 MCG/2ML IJ SOLN: 25.0000 ug | INTRAMUSCULAR | Status: DC | PRN

## 2019-03-07 MED ORDER — INDOMETHACIN 25 MG PO CAPS
50.0000 mg | ORAL_CAPSULE | ORAL | Status: AC
  Administered 2019-03-07: 11:00:00 50 mg via ORAL
  Filled 2019-03-07: qty 2

## 2019-03-07 SURGICAL SUPPLY — 17 items
CANISTER SUCT 3000ML PPV (MISCELLANEOUS) ×2 IMPLANT
GLOVE BIO SURGEON STRL SZ7.5 (GLOVE) ×2 IMPLANT
GLOVE BIOGEL PI IND STRL 7.0 (GLOVE) ×1 IMPLANT
GLOVE BIOGEL PI INDICATOR 7.0 (GLOVE) ×1
GOWN STRL REUS W/TWL LRG LVL3 (GOWN DISPOSABLE) ×6 IMPLANT
NEEDLE MAYO CATGUT SZ4 (NEEDLE) ×2 IMPLANT
NS IRRIG 1000ML POUR BTL (IV SOLUTION) ×2 IMPLANT
PACK VAGINAL MINOR WOMEN LF (CUSTOM PROCEDURE TRAY) ×2 IMPLANT
PAD OB MATERNITY 4.3X12.25 (PERSONAL CARE ITEMS) ×2 IMPLANT
PAD PREP 24X48 CUFFED NSTRL (MISCELLANEOUS) ×2 IMPLANT
SUT ETHIBOND  5 (SUTURE) ×1
SUT ETHIBOND 5 (SUTURE) ×1 IMPLANT
SUT PROLENE 0 CT 1 30 (SUTURE) ×2 IMPLANT
TOWEL OR 17X24 6PK STRL BLUE (TOWEL DISPOSABLE) ×4 IMPLANT
TRAY FOLEY W/BAG SLVR 14FR (SET/KITS/TRAYS/PACK) ×2 IMPLANT
TUBING NON-CON 1/4 X 20 CONN (TUBING) IMPLANT
YANKAUER SUCT BULB TIP NO VENT (SUCTIONS) IMPLANT

## 2019-03-07 NOTE — Op Note (Signed)
03/07/2019  10:35 AM  PATIENT:  Kelli Perkins  28 y.o. female  PRE-OPERATIVE DIAGNOSIS:  Cervical Insufficiency 19w 6d IUP  POST-OPERATIVE DIAGNOSIS:  Cervical Insufficiency  PROCEDURE:  Procedure(s): CERCLAGE CERVICAL-Rescue Shirodkar cerclage  SURGEON:  Surgeon(s): Brien Few, MD Murrell Redden Earlyne Iba, MD  ASSISTANTS: Murrell Redden, MD   ANESTHESIA:   spinal  ESTIMATED BLOOD LOSS: 10 mL   DRAINS: Urinary Catheter (Foley)   LOCAL MEDICATIONS USED:  NONE  SPECIMEN:  No Specimen  DISPOSITION OF SPECIMEN:  N/A  COUNTS:  YES  DICTATION #: noted  PLAN OF CARE: dc home  PATIENT DISPOSITION:  PACU - hemodynamically stable.

## 2019-03-07 NOTE — Anesthesia Postprocedure Evaluation (Signed)
Anesthesia Post Note  Patient: Kelli Perkins  Procedure(s) Performed: CERCLAGE CERVICAL (N/A Vagina )     Patient location during evaluation: PACU Anesthesia Type: Spinal Level of consciousness: awake Pain management: pain level controlled Vital Signs Assessment: post-procedure vital signs reviewed and stable Respiratory status: spontaneous breathing Cardiovascular status: stable Postop Assessment: no headache, no backache, spinal receding, patient able to bend at knees and no apparent nausea or vomiting Anesthetic complications: no    Last Vitals:  Vitals:   03/07/19 1330 03/07/19 1400  BP: 112/76 99/81  Pulse: 65 60  Resp: 18 20  Temp:    SpO2: 100% 100%    Last Pain:  Vitals:   03/07/19 1045  TempSrc: Oral   Pain Goal:    LLE Motor Response: Purposeful movement (03/07/19 1400) LLE Sensation: Tingling, Numbness (03/07/19 1400) RLE Motor Response: Purposeful movement (03/07/19 1400) RLE Sensation: Tingling, Numbness (03/07/19 1400)     Epidural/Spinal Function Cutaneous sensation: Tingles (03/07/19 1400), Patient able to flex knees: Yes (03/07/19 1400), Patient able to lift hips off bed: Yes (03/07/19 1400), Back pain beyond tenderness at insertion site: No (03/07/19 1400), Progressively worsening motor and/or sensory loss: No (03/07/19 1400), Bowel and/or bladder incontinence post epidural: No (03/07/19 1400)  [place]uston Foley

## 2019-03-07 NOTE — H&P (Signed)
Kelli Perkins is a 28 y.o. female presenting for Rescue cerclage.No history of cervical surgery or trauma. No risk factors for cervical insufficiency. CL 2w ago noted to be .7cm with funneling. Started on vaginal progesterone. Seen by MFM last week with CL 1.3cm. Pt requested cerclage after counseling. MFM discussed with patient "No ACOG indication for cerclage" at that time based on guidelines. Pt seen in office for FU 11/2 with CL now 0.5cm with funneling. Pt counseled about risks/benefits of cerclage including infection, SROM, bleeding and fetal loss.  OB History    Gravida  2   Para      Term      Preterm      AB  1   Living        SAB  1   TAB      Ectopic      Multiple      Live Births             Past Medical History:  Diagnosis Date  . Vaginal Pap smear, abnormal    Past Surgical History:  Procedure Laterality Date  . NO PAST SURGERIES     Family History: family history includes Cancer in her father; Hypertension in her father. Social History:  reports that she has never smoked. She has never used smokeless tobacco. She reports that she does not drink alcohol or use drugs.     Maternal Diabetes: No Genetic Screening: Normal Maternal Ultrasounds/Referrals: Normal Fetal Ultrasounds or other Referrals:  Referred to Materal Fetal Medicine  Maternal Substance Abuse:  No Significant Maternal Medications:  None vaginal prometrium Significant Maternal Lab Results:  Group B Strep negative Other Comments:  None  Review of Systems  Constitutional: Negative.   All other systems reviewed and are negative.  Maternal Medical History:  Fetal activity: Perceived fetal activity is normal.   Last perceived fetal movement was within the past hour.    Prenatal complications: no prenatal complications Prenatal Complications - Diabetes: none.      Blood pressure 120/77, pulse 70, temperature 98.3 F (36.8 C), temperature source Oral, resp. rate 20, height 5\' 6"   (1.676 m), weight 90.3 kg, last menstrual period 09/21/2018, SpO2 100 %. Maternal Exam:  Abdomen: Patient reports no abdominal tenderness. Fetal presentation: no presenting part  Introitus: Normal vulva. Normal vagina.  Ferning test: not done.  Nitrazine test: not done. Amniotic fluid character: not assessed.  Cervix: Cervix evaluated by digital exam.     Physical Exam  Nursing note and vitals reviewed. Constitutional: She is oriented to person, place, and time. She appears well-developed and well-nourished.  HENT:  Head: Normocephalic and atraumatic.  Neck: Normal range of motion. Neck supple.  Cardiovascular: Normal rate and regular rhythm.  Respiratory: Effort normal and breath sounds normal.  GI: Soft. Bowel sounds are normal.  Genitourinary:    Vulva, vagina and uterus normal.   Musculoskeletal: Normal range of motion.  Neurological: She is alert and oriented to person, place, and time. She has normal reflexes.  Skin: Skin is warm and dry.  Psychiatric: She has a normal mood and affect.    Prenatal labs: ABO, Rh: --/--/O POS (11/05 1013) Antibody: NEG (11/05 1013) Rubella: Immune (08/21 0000) RPR: NON REACTIVE (11/05 1030)  HBsAg: Negative (08/21 0000)  HIV: Non-reactive (08/21 0000)  GBS:   neg  Assessment/Plan: 19w 6d IUP Cervical insufficiency with CL 0.5cm with funneling and no improvement with vaginal progesterone Proceed with Rescue Vaginal Cerclage. Risks vs benefits discussed as  noted above. Pt acknowledges and wishes to proceed.   Daci Stubbe J 03/07/2019, 8:56 AM

## 2019-03-07 NOTE — Transfer of Care (Signed)
Immediate Anesthesia Transfer of Care Note  Patient: Kelli Perkins  Procedure(s) Performed: CERCLAGE CERVICAL (N/A Vagina )  Patient Location: PACU  Anesthesia Type:Spinal  Level of Consciousness: awake, alert  and oriented  Airway & Oxygen Therapy: Patient Spontanous Breathing and Patient connected to nasal cannula oxygen  Post-op Assessment: Report given to RN and Post -op Vital signs reviewed and stable  Post vital signs: Reviewed and stable  Last Vitals:  Vitals Value Taken Time  BP    Temp    Pulse 67 03/07/19 1044  Resp 16 03/07/19 1044  SpO2 100 % 03/07/19 1044  Vitals shown include unvalidated device data.  Last Pain:  Vitals:   03/07/19 0838  TempSrc: Oral         Complications: No apparent anesthesia complications

## 2019-03-07 NOTE — Op Note (Signed)
NAME: Kelli Perkins, Kelli Perkins MEDICAL RECORD [id number] ACCOUNT [id number] DATE OF BIRTH:1990-12-23 FACILITY: [facility] LOCATION: [id number] PHYSICIAN:Rainen Vanrossum J. Zaide Kardell, MD  OPERATIVE REPORT  DATE OF PROCEDURE:  03/07/2019  CHIEF COMPLAINT:  Cervical insufficiency at 19 weeks and 6 days with a cervical length of 0.5 cm and funneling refractory to progesterone therapy.  POSTOPERATIVE DIAGNOSIS:  Cervical insufficiency at 19 weeks and 6 days with a cervical length of 0.5 cm and funneling refractory to progesterone therapy.  PROCEDURE:  Shirodkar rescue vaginal cerclage.  SURGEON:  Brien Few, MD  ASSISTANTMurrell Redden.  ANESTHESIA:  Spinal by [place].    ESTIMATED BLOOD LOSS:  10 mL  COMPLICATIONS:  None.  DRAINS:  Foley.  COUNTS:  Correct.  DISPOSITION:  The patient was taken to recovery in good condition.  BRIEF OPERATIVE NOTE:  After being apprised of the risks of anesthesia, infection, bleeding, possible injury to surrounding organs, need for repair, possible risks to include infection, bleeding, rupture of membranes with fetal loss, the patient's  consent and signed.  She was brought to the operating room.  She was administered a spinal anesthetic without complications.  Prepped and draped in usual sterile fashion.  Foley catheter placed.  Vaginal exam reveals no evidence of exposed membranes.  A  cervical length palpably about 0.5 to 1 cm.  At this time, the anterior vaginal mucosa is dissected sharply using electrocautery, creating a 2-3 cm transverse incision with dissection in the standard Shirodkar fashion.  The 5 Ethibond suture is then  placed at the lateral edge of the incision and placed from 1 o'clock to 10 o'clock then from 8 o'clock to 6 o'clock from 9 o'clock to 7 o'clock then from 7 o'clock to 4 o'clock and 4 o'clock to 1 o'clock with spacing in between the various sutures.  This  was then tied down over a 0 Prolene stitch with good secure and establishment of about 1  to 1.5 cm cervical length.  Minimal bleeding was noted.  The Prolene ties also tied down to the base of the suture without complications.  Fetal heart tones are  heard pre and postprocedure.  The patient tolerated the procedure well and was transferred to recovery in good condition.  TN/NUANCE  D:03/07/2019 T:03/07/2019 JOB:[id number]

## 2019-03-07 NOTE — Anesthesia Procedure Notes (Signed)
Spinal  Patient location during procedure: OR Start time: 03/07/2019 10:05 AM End time: 03/07/2019 10:07 AM Staffing Anesthesiologist: Lyn Hollingshead, MD Performed: anesthesiologist  Preanesthetic Checklist Completed: patient identified, site marked, surgical consent, pre-op evaluation, timeout performed, IV checked, risks and benefits discussed and monitors and equipment checked Spinal Block Patient position: sitting Prep: site prepped and draped and DuraPrep Patient monitoring: continuous pulse ox and blood pressure Approach: midline Location: L3-4 Injection technique: single-shot Needle Needle type: Pencan  Needle gauge: 24 G Needle length: 10 cm Needle insertion depth: 5 cm Assessment Sensory level: T10 Events: paresthesia Additional Notes R foot X 1. Medicine injected after confirmation that pain had subsided.

## 2019-03-07 NOTE — Anesthesia Preprocedure Evaluation (Signed)
Anesthesia Evaluation  Patient identified by MRN, date of birth, ID band Patient awake    Reviewed: Allergy & Precautions, H&P , NPO status , Patient's Chart, lab work & pertinent test results  Airway Mallampati: I  TM Distance: >3 FB Neck ROM: full    Dental no notable dental hx. (+) Teeth Intact   Pulmonary neg pulmonary ROS,    Pulmonary exam normal breath sounds clear to auscultation       Cardiovascular negative cardio ROS Normal cardiovascular exam Rhythm:regular Rate:Normal     Neuro/Psych negative neurological ROS  negative psych ROS   GI/Hepatic negative GI ROS, Neg liver ROS,   Endo/Other  negative endocrine ROS  Renal/GU negative Renal ROS  negative genitourinary   Musculoskeletal negative musculoskeletal ROS (+)   Abdominal (+) + obese,   Peds  Hematology negative hematology ROS (+)   Anesthesia Other Findings   Reproductive/Obstetrics (+) Pregnancy                             Anesthesia Physical Anesthesia Plan  ASA: II  Anesthesia Plan: Spinal   Post-op Pain Management:    Induction:   PONV Risk Score and Plan: 2 and Treatment may vary due to age or medical condition  Airway Management Planned: Nasal Cannula and Natural Airway  Additional Equipment: None  Intra-op Plan:   Post-operative Plan:   Informed Consent: I have reviewed the patients History and Physical, chart, labs and discussed the procedure including the risks, benefits and alternatives for the proposed anesthesia with the patient or authorized representative who has indicated his/her understanding and acceptance.       Plan Discussed with: CRNA  Anesthesia Plan Comments:         Anesthesia Quick Evaluation

## 2019-03-07 NOTE — H&P (Signed)
Patient seen and examined. Consent witnessed and signed. No changes noted. Update completed.  BP 120/77   Pulse 70   Temp 98.3 F (36.8 C) (Oral)   Resp 20   Ht 5\' 6"  (1.676 m)   Wt 90.3 kg   LMP 09/21/2018   SpO2 100%   BMI 32.12 kg/m    CBC    Component Value Date/Time   WBC 10.2 03/06/2019 1013   RBC 4.00 03/06/2019 1013   HGB 11.6 (L) 03/06/2019 1013   HCT 35.5 (L) 03/06/2019 1013   PLT 248 03/06/2019 1013   MCV 88.8 03/06/2019 1013   MCH 29.0 03/06/2019 1013   MCHC 32.7 03/06/2019 1013   RDW 13.0 03/06/2019 1013

## 2019-03-07 NOTE — Discharge Instructions (Signed)
See AVS D/C instructions. Follow up in office on Nov 10th. If unable to void by 10pm tonight to call Dr. Ronita Hipps. Verbalizes understanding.

## 2019-03-11 DIAGNOSIS — O26872 Cervical shortening, second trimester: Secondary | ICD-10-CM | POA: Diagnosis not present

## 2019-03-11 DIAGNOSIS — Z3686 Encounter for antenatal screening for cervical length: Secondary | ICD-10-CM | POA: Diagnosis not present

## 2019-03-11 DIAGNOSIS — Z3A20 20 weeks gestation of pregnancy: Secondary | ICD-10-CM | POA: Diagnosis not present

## 2019-03-11 DIAGNOSIS — Z362 Encounter for other antenatal screening follow-up: Secondary | ICD-10-CM | POA: Diagnosis not present

## 2019-03-18 DIAGNOSIS — O24410 Gestational diabetes mellitus in pregnancy, diet controlled: Secondary | ICD-10-CM | POA: Diagnosis not present

## 2019-03-18 DIAGNOSIS — Z3A21 21 weeks gestation of pregnancy: Secondary | ICD-10-CM | POA: Diagnosis not present

## 2019-03-18 DIAGNOSIS — O26872 Cervical shortening, second trimester: Secondary | ICD-10-CM | POA: Diagnosis not present

## 2019-03-18 DIAGNOSIS — O3432 Maternal care for cervical incompetence, second trimester: Secondary | ICD-10-CM | POA: Diagnosis not present

## 2019-03-25 DIAGNOSIS — O24410 Gestational diabetes mellitus in pregnancy, diet controlled: Secondary | ICD-10-CM | POA: Diagnosis not present

## 2019-03-25 DIAGNOSIS — O3432 Maternal care for cervical incompetence, second trimester: Secondary | ICD-10-CM | POA: Diagnosis not present

## 2019-03-25 DIAGNOSIS — Z3A22 22 weeks gestation of pregnancy: Secondary | ICD-10-CM | POA: Diagnosis not present

## 2019-03-31 DIAGNOSIS — Z3A23 23 weeks gestation of pregnancy: Secondary | ICD-10-CM | POA: Diagnosis not present

## 2019-03-31 DIAGNOSIS — O3432 Maternal care for cervical incompetence, second trimester: Secondary | ICD-10-CM | POA: Diagnosis not present

## 2019-03-31 DIAGNOSIS — O24410 Gestational diabetes mellitus in pregnancy, diet controlled: Secondary | ICD-10-CM | POA: Diagnosis not present

## 2019-04-10 DIAGNOSIS — O3432 Maternal care for cervical incompetence, second trimester: Secondary | ICD-10-CM | POA: Diagnosis not present

## 2019-04-10 DIAGNOSIS — Z3A24 24 weeks gestation of pregnancy: Secondary | ICD-10-CM | POA: Diagnosis not present

## 2019-04-16 DIAGNOSIS — Z3A25 25 weeks gestation of pregnancy: Secondary | ICD-10-CM | POA: Diagnosis not present

## 2019-04-16 DIAGNOSIS — O24410 Gestational diabetes mellitus in pregnancy, diet controlled: Secondary | ICD-10-CM | POA: Diagnosis not present

## 2019-04-16 DIAGNOSIS — O3432 Maternal care for cervical incompetence, second trimester: Secondary | ICD-10-CM | POA: Diagnosis not present

## 2019-04-21 DIAGNOSIS — R609 Edema, unspecified: Secondary | ICD-10-CM | POA: Diagnosis not present

## 2019-04-23 DIAGNOSIS — O3432 Maternal care for cervical incompetence, second trimester: Secondary | ICD-10-CM | POA: Diagnosis not present

## 2019-04-23 DIAGNOSIS — Z3A26 26 weeks gestation of pregnancy: Secondary | ICD-10-CM | POA: Diagnosis not present

## 2019-04-23 DIAGNOSIS — O24410 Gestational diabetes mellitus in pregnancy, diet controlled: Secondary | ICD-10-CM | POA: Diagnosis not present

## 2019-05-01 DIAGNOSIS — Z3A27 27 weeks gestation of pregnancy: Secondary | ICD-10-CM | POA: Diagnosis not present

## 2019-05-01 DIAGNOSIS — O3432 Maternal care for cervical incompetence, second trimester: Secondary | ICD-10-CM | POA: Diagnosis not present

## 2019-05-01 DIAGNOSIS — O24410 Gestational diabetes mellitus in pregnancy, diet controlled: Secondary | ICD-10-CM | POA: Diagnosis not present

## 2019-05-01 DIAGNOSIS — Z3689 Encounter for other specified antenatal screening: Secondary | ICD-10-CM | POA: Diagnosis not present

## 2019-05-02 NOTE — L&D Delivery Note (Signed)
Delivery Note At 1:31 PM a viable and healthy female was delivered via Vaginal, Spontaneous (Presentation:   LOA   ).  APGAR: 9, 9; weight  pending.   Placenta status: Spontaneous, Intact.  Cord: 3 vessels with the following complications: None.  Cord pH: na  Anesthesia: Epidural Episiotomy: None Lacerations: Cervical;Perineal Suture Repair: chromic vicryl rapide Est. Blood Loss (mL):  300 Cervical laceration repaired with 0 Chromic.  Mom to postpartum.  Baby to Couplet care / Skin to Skin.  Kelli Perkins J 07/11/2019, 1:54 PM

## 2019-05-05 DIAGNOSIS — M5489 Other dorsalgia: Secondary | ICD-10-CM | POA: Diagnosis not present

## 2019-05-09 DIAGNOSIS — Z23 Encounter for immunization: Secondary | ICD-10-CM | POA: Diagnosis not present

## 2019-05-09 DIAGNOSIS — Z3A28 28 weeks gestation of pregnancy: Secondary | ICD-10-CM | POA: Diagnosis not present

## 2019-05-09 DIAGNOSIS — O3433 Maternal care for cervical incompetence, third trimester: Secondary | ICD-10-CM | POA: Diagnosis not present

## 2019-05-09 DIAGNOSIS — Z3689 Encounter for other specified antenatal screening: Secondary | ICD-10-CM | POA: Diagnosis not present

## 2019-05-09 DIAGNOSIS — O99810 Abnormal glucose complicating pregnancy: Secondary | ICD-10-CM | POA: Diagnosis not present

## 2019-05-14 ENCOUNTER — Encounter: Payer: BC Managed Care – PPO | Attending: Obstetrics and Gynecology | Admitting: Registered"

## 2019-05-14 ENCOUNTER — Other Ambulatory Visit: Payer: Self-pay

## 2019-05-14 ENCOUNTER — Encounter: Payer: Self-pay | Admitting: Registered"

## 2019-05-14 DIAGNOSIS — O99810 Abnormal glucose complicating pregnancy: Secondary | ICD-10-CM | POA: Diagnosis not present

## 2019-05-14 DIAGNOSIS — O24410 Gestational diabetes mellitus in pregnancy, diet controlled: Secondary | ICD-10-CM | POA: Diagnosis not present

## 2019-05-14 DIAGNOSIS — O3433 Maternal care for cervical incompetence, third trimester: Secondary | ICD-10-CM | POA: Diagnosis not present

## 2019-05-14 DIAGNOSIS — O24419 Gestational diabetes mellitus in pregnancy, unspecified control: Secondary | ICD-10-CM | POA: Diagnosis not present

## 2019-05-14 DIAGNOSIS — Z3A29 29 weeks gestation of pregnancy: Secondary | ICD-10-CM | POA: Diagnosis not present

## 2019-05-14 NOTE — Progress Notes (Signed)

## 2019-05-21 DIAGNOSIS — O24419 Gestational diabetes mellitus in pregnancy, unspecified control: Secondary | ICD-10-CM | POA: Diagnosis not present

## 2019-05-21 DIAGNOSIS — Z3A30 30 weeks gestation of pregnancy: Secondary | ICD-10-CM | POA: Diagnosis not present

## 2019-05-29 DIAGNOSIS — O24419 Gestational diabetes mellitus in pregnancy, unspecified control: Secondary | ICD-10-CM | POA: Diagnosis not present

## 2019-05-29 DIAGNOSIS — Z3A31 31 weeks gestation of pregnancy: Secondary | ICD-10-CM | POA: Diagnosis not present

## 2019-05-29 DIAGNOSIS — O24410 Gestational diabetes mellitus in pregnancy, diet controlled: Secondary | ICD-10-CM | POA: Diagnosis not present

## 2019-05-29 DIAGNOSIS — O3433 Maternal care for cervical incompetence, third trimester: Secondary | ICD-10-CM | POA: Diagnosis not present

## 2019-06-02 DIAGNOSIS — Z3A32 32 weeks gestation of pregnancy: Secondary | ICD-10-CM | POA: Diagnosis not present

## 2019-06-02 DIAGNOSIS — O24419 Gestational diabetes mellitus in pregnancy, unspecified control: Secondary | ICD-10-CM | POA: Diagnosis not present

## 2019-06-02 DIAGNOSIS — O3433 Maternal care for cervical incompetence, third trimester: Secondary | ICD-10-CM | POA: Diagnosis not present

## 2019-06-12 DIAGNOSIS — Z3A33 33 weeks gestation of pregnancy: Secondary | ICD-10-CM | POA: Diagnosis not present

## 2019-06-12 DIAGNOSIS — O24419 Gestational diabetes mellitus in pregnancy, unspecified control: Secondary | ICD-10-CM | POA: Diagnosis not present

## 2019-06-12 DIAGNOSIS — O365930 Maternal care for other known or suspected poor fetal growth, third trimester, not applicable or unspecified: Secondary | ICD-10-CM | POA: Diagnosis not present

## 2019-06-18 DIAGNOSIS — O24419 Gestational diabetes mellitus in pregnancy, unspecified control: Secondary | ICD-10-CM | POA: Diagnosis not present

## 2019-06-18 DIAGNOSIS — O3433 Maternal care for cervical incompetence, third trimester: Secondary | ICD-10-CM | POA: Diagnosis not present

## 2019-06-18 DIAGNOSIS — Z3A34 34 weeks gestation of pregnancy: Secondary | ICD-10-CM | POA: Diagnosis not present

## 2019-06-18 DIAGNOSIS — O24410 Gestational diabetes mellitus in pregnancy, diet controlled: Secondary | ICD-10-CM | POA: Diagnosis not present

## 2019-06-27 DIAGNOSIS — Z3A35 35 weeks gestation of pregnancy: Secondary | ICD-10-CM | POA: Diagnosis not present

## 2019-06-27 DIAGNOSIS — O24410 Gestational diabetes mellitus in pregnancy, diet controlled: Secondary | ICD-10-CM | POA: Diagnosis not present

## 2019-06-27 DIAGNOSIS — O3433 Maternal care for cervical incompetence, third trimester: Secondary | ICD-10-CM | POA: Diagnosis not present

## 2019-06-27 DIAGNOSIS — O24419 Gestational diabetes mellitus in pregnancy, unspecified control: Secondary | ICD-10-CM | POA: Diagnosis not present

## 2019-06-27 DIAGNOSIS — Z3685 Encounter for antenatal screening for Streptococcus B: Secondary | ICD-10-CM | POA: Diagnosis not present

## 2019-06-27 DIAGNOSIS — Z3A34 34 weeks gestation of pregnancy: Secondary | ICD-10-CM | POA: Diagnosis not present

## 2019-06-30 ENCOUNTER — Inpatient Hospital Stay (HOSPITAL_COMMUNITY)
Admission: AD | Admit: 2019-06-30 | Discharge: 2019-06-30 | Disposition: A | Discharge status: Home / Self Care | Payer: BC Managed Care – PPO | Attending: Obstetrics and Gynecology | Admitting: Obstetrics and Gynecology

## 2019-06-30 ENCOUNTER — Encounter (HOSPITAL_COMMUNITY): Payer: Self-pay | Admitting: Obstetrics and Gynecology

## 2019-06-30 ENCOUNTER — Other Ambulatory Visit: Payer: Self-pay

## 2019-06-30 DIAGNOSIS — O99891 Other specified diseases and conditions complicating pregnancy: Secondary | ICD-10-CM

## 2019-06-30 DIAGNOSIS — O26893 Other specified pregnancy related conditions, third trimester: Secondary | ICD-10-CM | POA: Diagnosis not present

## 2019-06-30 DIAGNOSIS — M549 Dorsalgia, unspecified: Secondary | ICD-10-CM

## 2019-06-30 DIAGNOSIS — Z3A36 36 weeks gestation of pregnancy: Secondary | ICD-10-CM | POA: Diagnosis not present

## 2019-06-30 DIAGNOSIS — O479 False labor, unspecified: Secondary | ICD-10-CM

## 2019-06-30 DIAGNOSIS — Z3689 Encounter for other specified antenatal screening: Secondary | ICD-10-CM

## 2019-06-30 DIAGNOSIS — O3433 Maternal care for cervical incompetence, third trimester: Secondary | ICD-10-CM

## 2019-06-30 LAB — CBC
HCT: 36.5 % (ref 36.0–46.0)
Hemoglobin: 11.5 g/dL — ABNORMAL LOW (ref 12.0–15.0)
MCH: 28.3 pg (ref 26.0–34.0)
MCHC: 31.5 g/dL (ref 30.0–36.0)
MCV: 89.9 fL (ref 80.0–100.0)
Platelets: 189 10*3/uL (ref 150–400)
RBC: 4.06 MIL/uL (ref 3.87–5.11)
RDW: 13.7 % (ref 11.5–15.5)
WBC: 8.4 10*3/uL (ref 4.0–10.5)
nRBC: 0 % (ref 0.0–0.2)

## 2019-06-30 LAB — COMPREHENSIVE METABOLIC PANEL WITH GFR
ALT: 24 U/L (ref 0–44)
AST: 21 U/L (ref 15–41)
Albumin: 2.8 g/dL — ABNORMAL LOW (ref 3.5–5.0)
Alkaline Phosphatase: 99 U/L (ref 38–126)
Anion gap: 9 (ref 5–15)
BUN: 11 mg/dL (ref 6–20)
CO2: 22 mmol/L (ref 22–32)
Calcium: 9.2 mg/dL (ref 8.9–10.3)
Chloride: 105 mmol/L (ref 98–111)
Creatinine, Ser: 0.65 mg/dL (ref 0.44–1.00)
GFR calc Af Amer: >60 mL/min
GFR calc non Af Amer: >60 mL/min
Glucose, Bld: 80 mg/dL (ref 70–99)
Potassium: 3.9 mmol/L (ref 3.5–5.1)
Sodium: 136 mmol/L (ref 135–145)
Total Bilirubin: 0.4 mg/dL (ref 0.3–1.2)
Total Protein: 6.1 g/dL — ABNORMAL LOW (ref 6.5–8.1)

## 2019-06-30 LAB — URINALYSIS, ROUTINE W REFLEX MICROSCOPIC
Bilirubin Urine: NEGATIVE
Glucose, UA: NEGATIVE mg/dL
Hgb urine dipstick: NEGATIVE
Ketones, ur: NEGATIVE mg/dL
Leukocytes,Ua: NEGATIVE
Nitrite: NEGATIVE
Protein, ur: NEGATIVE mg/dL
Specific Gravity, Urine: 1.005 (ref 1.005–1.030)
pH: 6 (ref 5.0–8.0)

## 2019-06-30 LAB — PROTEIN / CREATININE RATIO, URINE
Creatinine, Urine: 25.72 mg/dL
Total Protein, Urine: <6 mg/dL

## 2019-06-30 LAB — OB RESULTS CONSOLE GBS: GBS: POSITIVE

## 2019-06-30 NOTE — Discharge Instructions (Signed)

## 2019-06-30 NOTE — MAU Note (Signed)
Kelli Perkins is a 29 y.o. at [redacted]w[redacted]d here in MAU reporting: for the past 20-30 minutes she has been feeling pressure and intermittent abdominal pain. Has a cerclage and doesn't want to dilate through that. No bleeding or LOF. +FM  Onset of complaint: today  Pain score: 6/10  Vitals:   06/30/19 1338  BP: (!) 137/92  Pulse: 75  Resp: 18  Temp: 98.8 F (37.1 C)  SpO2: 100%     FHT: +FM  Lab orders placed from triage: UA

## 2019-06-30 NOTE — MAU Note (Signed)
I have communicated with Sabas Sous, CNM  and reviewed vital signs:  Vitals:   06/30/19 1600 06/30/19 1617  BP: 119/74 119/74  Pulse: 72   Resp:    Temp:    SpO2:      Vaginal exam:  Dilation: Fingertip Effacement (%): 100 Cervical Position: Anterior Station: -1 Presentation: Vertex Exam by:: T Melchor Kirchgessner RN,   Also reviewed contraction pattern and that non-stress test is reactive.  It has been documented that patient is having irregular contractions and irritability with no cervical change since last office visit, indicating patient is not in labor.  Patient denies any other complaints.  Based on this report provider has given order for discharge.  A discharge order and diagnosis entered by a provider.   Labor discharge instructions reviewed with patient.

## 2019-06-30 NOTE — Progress Notes (Signed)
Notified provider about patient's blood pressure.

## 2019-06-30 NOTE — MAU Provider Note (Addendum)
History     CSN: [id number]  Arrival date and time: 06/30/19 1313   First Provider Initiated Contact with Patient 06/30/19 1412      Chief Complaint  Patient presents with  . Back Pain   Kelli Perkins is a 29 y.o. G2P0010 at [redacted]w[redacted]d who receives care at [facility].  She presents today for Back Pain.  She states she started experiencing the pain about 15 minutes prior to arrival.  She describes the pain as an achy left side pain that is intermittent.  She states "it feels like I need to stretch it or something."  She also reports some pressure, but denies vaginal concerns including leaking, bleeding, and odor.  She states that the pain is a 5/10 when it occurs, but that she is not currently experiencing any pain. She reports drinking about 2 bottles of water today.  She denies sexual activity in the past 3 days.  She reports having a cerclage in place and it is to be removed on Thursday.  Patient speaking very rapidly and appears anxious.      OB History    Gravida  2   Para      Term      Preterm      AB  1   Living        SAB  1   TAB      Ectopic      Multiple      Live Births              Past Medical History:  Diagnosis Date  . Vaginal Pap smear, abnormal     Past Surgical History:  Procedure Laterality Date  . CERVICAL CERCLAGE N/A 03/07/2019   Procedure: CERCLAGE CERVICAL;  Surgeon: Olivia Mackie, MD;  Location: [facility] LD ORS;  Service: Gynecology;  Laterality: N/A;  EDD: 07/26/19  . NO PAST SURGERIES      Family History  Problem Relation Age of Onset  . Cancer Father   . Hypertension Father     Social History   Tobacco Use  . Smoking status: Never Smoker  . Smokeless tobacco: Never Used  Substance Use Topics  . Alcohol use: No    Alcohol/week: 0.0 standard drinks  . Drug use: No    Allergies: No Known Allergies  Medications Prior to Admission  Medication Sig Dispense Refill Last Dose  . Prenatal Vit-Fe Fumarate-FA (PRENATAL  MULTIVITAMIN) TABS tablet Take 1 tablet by mouth daily.    06/30/2019 at Unknown time  . progesterone (PROMETRIUM) 200 MG capsule Place 200 mg vaginally at bedtime.   06/29/2019 at Unknown time  . acetaminophen (TYLENOL) 325 MG tablet Take 325 mg by mouth every 6 (six) hours as needed for moderate pain or headache.      . calcium carbonate (TUMS - DOSED IN MG ELEMENTAL CALCIUM) 500 MG chewable tablet Chew 1 tablet by mouth daily as needed for indigestion or heartburn.      . Doxylamine-Pyridoxine 10-10 MG TBEC Take 1 tablet by mouth daily as needed (nausea).        Review of Systems  Constitutional: Negative for chills and fever.  Eyes: Negative for visual disturbance.  Respiratory: Negative for cough and shortness of breath.   Gastrointestinal: Negative for abdominal pain, nausea and vomiting.  Genitourinary: Negative for difficulty urinating, dysuria, vaginal bleeding and vaginal discharge.  Musculoskeletal: Positive for back pain.  Neurological: Negative for dizziness, light-headedness and headaches.   Physical Exam   Blood  pressure 130/88, pulse 69, temperature 98.8 F (37.1 C), temperature source Oral, resp. rate 18, height 5\' 6"  (1.676 m), weight 101.3 kg, last menstrual period 09/21/2018, SpO2 100 %.  Physical Exam  Constitutional: She is oriented to person, place, and time. She appears well-developed and well-nourished.  HENT:  Head: Normocephalic and atraumatic.  Eyes: Conjunctivae are normal.  Cardiovascular: Normal rate.  Respiratory: Effort normal.  GI: Soft.  Musculoskeletal:        General: Normal range of motion.     Cervical back: Normal range of motion.  Neurological: She is alert and oriented to person, place, and time.  Skin: Skin is warm and dry.  Psychiatric: She has a normal mood and affect. Her behavior is normal.    Fetal Assessment 125 bpm, Mod Var, -Decels, +Accels Toco: Occasional, palpates mild  MAU Course   Results for orders placed or performed  during the hospital encounter of 06/30/19 (from the past 24 hour(s))  Urinalysis, Routine w reflex microscopic     Status: Abnormal   Collection Time: 06/30/19  1:38 PM  Result Value Ref Range   Color, Urine STRAW (A) YELLOW   APPearance CLEAR CLEAR   Specific Gravity, Urine 1.005 1.005 - 1.030   pH 6.0 5.0 - 8.0   Glucose, UA NEGATIVE NEGATIVE mg/dL   Hgb urine dipstick NEGATIVE NEGATIVE   Bilirubin Urine NEGATIVE NEGATIVE   Ketones, ur NEGATIVE NEGATIVE mg/dL   Protein, ur NEGATIVE NEGATIVE mg/dL   Nitrite NEGATIVE NEGATIVE   Leukocytes,Ua NEGATIVE NEGATIVE  Protein / creatinine ratio, urine     Status: None   Collection Time: 06/30/19  2:22 PM  Result Value Ref Range   Creatinine, Urine 25.72 mg/dL   Total Protein, Urine <6 mg/dL   Protein Creatinine Ratio        0.00 - 0.15 mg/mg[Cre]  CBC     Status: Abnormal   Collection Time: 06/30/19  2:29 PM  Result Value Ref Range   WBC 8.4 4.0 - 10.5 K/uL   RBC 4.06 3.87 - 5.11 MIL/uL   Hemoglobin 11.5 (L) 12.0 - 15.0 g/dL   HCT 08/30/19 61.6 - 07.3 %   MCV 89.9 80.0 - 100.0 fL   MCH 28.3 26.0 - 34.0 pg   MCHC 31.5 30.0 - 36.0 g/dL   RDW 71.0 62.6 - 94.8 %   Platelets 189 150 - 400 K/uL   nRBC 0.0 0.0 - 0.2 %  Comprehensive metabolic panel     Status: Abnormal   Collection Time: 06/30/19  2:29 PM  Result Value Ref Range   Sodium 136 135 - 145 mmol/L   Potassium 3.9 3.5 - 5.1 mmol/L   Chloride 105 98 - 111 mmol/L   CO2 22 22 - 32 mmol/L   Glucose, Bld 80 70 - 99 mg/dL   BUN 11 6 - 20 mg/dL   Creatinine, Ser 08/30/19 0.44 - 1.00 mg/dL   Calcium 9.2 8.9 - 2.70 mg/dL   Total Protein 6.1 (L) 6.5 - 8.1 g/dL   Albumin 2.8 (L) 3.5 - 5.0 g/dL   AST 21 15 - 41 U/L   ALT 24 0 - 44 U/L   Alkaline Phosphatase 99 38 - 126 U/L   Total Bilirubin 0.4 0.3 - 1.2 mg/dL   GFR calc non Af Amer >60 >60 mL/min   GFR calc Af Amer >60 >60 mL/min   Anion gap 9 5 - 15   No results found.  MDM PE Labs: UA, CBC, CMP, PC  Ratio Measure BPQ15  min EFM Assessment and Plan  29 year old G2P0010  SIUP at 36.2weeks Cat I FT Back Pain Contractions   -Exam findings discussed. -Initial BP slightly elevated.  Repeat with variations. Review of PN record reveals no history of elevations. Will do PreEclampsia work up.  -Patient offered and declines pain medication. -Will monitor and reassess. -Will give fluids.  -NST reactive   Maryann Conners MSN, CNM 06/30/2019, 2:12 PM   Reassessment (4:04 PM) -PreEclampsia labs return normal -Patient informed of results -Reports no pain since arrival.  -Instructed to keep appt as scheduled. -Encouraged to call or return to MAU if symptoms worsen or with the onset of new symptoms. -Discharged to home in stable condition.  Maryann Conners MSN, CNM Advanced Practice Provider, Center for [employer]

## 2019-07-02 ENCOUNTER — Other Ambulatory Visit: Payer: Self-pay

## 2019-07-02 ENCOUNTER — Inpatient Hospital Stay (HOSPITAL_COMMUNITY)
Admission: AD | Admit: 2019-07-02 | Discharge: 2019-07-03 | Disposition: A | Discharge status: Home / Self Care | Payer: BC Managed Care – PPO | Attending: Obstetrics and Gynecology | Admitting: Obstetrics and Gynecology

## 2019-07-02 DIAGNOSIS — Z3A36 36 weeks gestation of pregnancy: Secondary | ICD-10-CM | POA: Insufficient documentation

## 2019-07-02 DIAGNOSIS — O479 False labor, unspecified: Secondary | ICD-10-CM

## 2019-07-03 ENCOUNTER — Inpatient Hospital Stay (EMERGENCY_DEPARTMENT_HOSPITAL)
Admission: AD | Admit: 2019-07-03 | Discharge: 2019-07-04 | Disposition: A | Discharge status: Home / Self Care | Payer: BC Managed Care – PPO | Source: Home / Self Care | Attending: Obstetrics and Gynecology | Admitting: Obstetrics and Gynecology

## 2019-07-03 ENCOUNTER — Encounter (HOSPITAL_COMMUNITY): Payer: Self-pay | Admitting: Obstetrics and Gynecology

## 2019-07-03 DIAGNOSIS — Z3A36 36 weeks gestation of pregnancy: Secondary | ICD-10-CM | POA: Diagnosis not present

## 2019-07-03 DIAGNOSIS — O4703 False labor before 37 completed weeks of gestation, third trimester: Secondary | ICD-10-CM | POA: Diagnosis not present

## 2019-07-03 DIAGNOSIS — O471 False labor at or after 37 completed weeks of gestation: Secondary | ICD-10-CM

## 2019-07-03 DIAGNOSIS — O24419 Gestational diabetes mellitus in pregnancy, unspecified control: Secondary | ICD-10-CM | POA: Diagnosis not present

## 2019-07-03 LAB — URINALYSIS, ROUTINE W REFLEX MICROSCOPIC
Bilirubin Urine: NEGATIVE
Glucose, UA: NEGATIVE mg/dL
Ketones, ur: NEGATIVE mg/dL
Leukocytes,Ua: NEGATIVE
Nitrite: NEGATIVE
Protein, ur: NEGATIVE mg/dL
Specific Gravity, Urine: 1.003 — ABNORMAL LOW (ref 1.005–1.030)
pH: 6 (ref 5.0–8.0)

## 2019-07-03 MED ORDER — ACETAMINOPHEN 325 MG PO TABS
650.0000 mg | ORAL_TABLET | Freq: Once | ORAL | Status: AC
  Administered 2019-07-03: 650 mg via ORAL
  Filled 2019-07-03: qty 2

## 2019-07-03 NOTE — Discharge Instructions (Signed)

## 2019-07-03 NOTE — MAU Note (Signed)
Pt had cerclage removed today about 1430 and tol well. Cervix was 2cm then. Having ctxs tonight and slight spotting. Was here last night for ctxs.

## 2019-07-03 NOTE — MAU Note (Addendum)
Having ctxs since about 2000. Denies LOF or VB. Some nausea earlier but better now. Has cerclage that is to be removed today at 1415 in office

## 2019-07-03 NOTE — MAU Provider Note (Signed)
Chief Complaint:  Contractions   First Provider Initiated Contact with Patient 07/03/19 0044     HPI  HPI: Kelli Perkins is a 29 y.o. G2P0010 at 64w5dwho presents to maternity admissions reporting contractions.  Cerclage is to be removed later today.  Seen three days ago for pressure. Had one initial BP elevated with others normal  Preeclampsia workup was negative.  . She reports good fetal movement, denies LOF, vaginal bleeding, vaginal itching/burning, urinary symptoms, h/a, dizziness, n/v, diarrhea, constipation or fever/chills.  She denies headache, visual changes or RUQ abdominal pain.  RN note: Having ctxs since about 2000. Denies LOF or VB. Some nausea earlier but better now. Has cerclage that is to be removed today at 1415 in office  Past Medical History: Past Medical History:  Diagnosis Date  . Vaginal Pap smear, abnormal     Past obstetric history: OB History  Gravida Para Term Preterm AB Living  2       1    SAB TAB Ectopic Multiple Live Births  1            # Outcome Date GA Lbr Len/2nd Weight Sex Delivery Anes PTL Lv  2 Current           1 SAB 07/2018 [redacted]w[redacted]d           Past Surgical History: Past Surgical History:  Procedure Laterality Date  . CERVICAL CERCLAGE N/A 03/07/2019   Procedure: CERCLAGE CERVICAL;  Surgeon: Olivia Mackie, MD;  Location: [facility] LD [facility]S;  Service: Gynecology;  Laterality: N/A;  EDD: 07/26/19  . NO PAST SURGERIES      Family History: Family History  Problem Relation Age of Onset  . Cancer Father   . Hypertension Father     Social History: Social History   Tobacco Use  . Smoking status: Never Smoker  . Smokeless tobacco: Never Used  Substance Use Topics  . Alcohol use: No    Alcohol/week: 0.0 standard drinks  . Drug use: No    Allergies: No Known Allergies  Meds:  Medications Prior to Admission  Medication Sig Dispense Refill Last Dose  . Prenatal Vit-Fe Fumarate-FA (PRENATAL MULTIVITAMIN) TABS tablet Take 1 tablet by mouth  daily.    07/03/2019 at Unknown time  . progesterone (PROMETRIUM) 200 MG capsule Place 200 mg vaginally at bedtime.   07/02/2019 at Unknown time  . acetaminophen (TYLENOL) 325 MG tablet Take 325 mg by mouth every 6 (six) hours as needed for moderate pain or headache.    More than a month at Unknown time  . calcium carbonate (TUMS - DOSED IN MG ELEMENTAL CALCIUM) 500 MG chewable tablet Chew 1 tablet by mouth daily as needed for indigestion or heartburn.    More than a month at Unknown time  . Doxylamine-Pyridoxine 10-10 MG TBEC Take 1 tablet by mouth daily as needed (nausea).    More than a month at Unknown time    I have reviewed patient's Past Medical Hx, Surgical Hx, Family Hx, Social Hx, medications and allergies.   ROS:  Review of Systems  Constitutional: Negative for chills and fever.  Respiratory: Negative for shortness of breath.   Gastrointestinal: Positive for abdominal pain. Negative for diarrhea, nausea and vomiting.  Genitourinary: Negative for vaginal bleeding.  Neurological: Negative for headaches.   Other systems negative  Physical Exam   Patient Vitals for the past 24 hrs:  BP Temp Pulse Resp SpO2 Height Weight  07/03/19 0042 (!) 134/57 -- 70 -- -- -- --  07/03/19 0032 136/80 -- 65 -- 98 % -- --  07/03/19 0004 (!) 153/86 -- 64 -- -- -- --  07/03/19 0001 -- 98.5 F (36.9 C) -- 18 -- 5\' 6"  (1.676 m) 100.7 kg   Constitutional: Well-developed, well-nourished female in no acute distress.  Cardiovascular: normal rate and rhythm Respiratory: normal effort, clear to auscultation bilaterally GI: Abd soft, non-tender, gravid appropriate for gestational age.   MS: Extremities nontender, no edema, normal ROM Neurologic: Alert and oriented x 4.  GU: Neg CVAT.  PELVIC EXAM:  Dilation: 1.5 Effacement (%): 100 Cervical Position: Anterior Station: -1 Presentation: Vertex Exam by:: Therisa Doyne, RN  FHT:  Baseline 140 , moderate variability, accelerations present, no  decelerations Contractions: Occasional  Irregular     Labs: Results for orders placed or performed during the hospital encounter of 07/02/19 (from the past 24 hour(s))  Urinalysis, Routine w reflex microscopic     Status: Abnormal   Collection Time: 07/03/19 12:08 AM  Result Value Ref Range   Color, Urine STRAW (A) YELLOW   APPearance CLEAR CLEAR   Specific Gravity, Urine 1.003 (L) 1.005 - 1.030   pH 6.0 5.0 - 8.0   Glucose, UA NEGATIVE NEGATIVE mg/dL   Hgb urine dipstick SMALL (A) NEGATIVE   Bilirubin Urine NEGATIVE NEGATIVE   Ketones, ur NEGATIVE NEGATIVE mg/dL   Protein, ur NEGATIVE NEGATIVE mg/dL   Nitrite NEGATIVE NEGATIVE   Leukocytes,Ua NEGATIVE NEGATIVE   RBC / HPF 0-5 0 - 5 RBC/hpf   WBC, UA 0-5 0 - 5 WBC/hpf   Bacteria, UA FEW (A) NONE SEEN   Squamous Epithelial / LPF 0-5 0 - 5   --/--/O POS (11/05 1013)  Imaging:  No results found.  MAU Course/MDM: I have ordered labs and reviewed results. UA is negative Initial triage BP elevated but others normal   Pt is quite anxious.  Workup 3 days ago was negative NST reviewed, reactive Recheck of cervix is unchanged  Assessment: Single IUP at [redacted]w[redacted]d Occasional uterine contractions  Plan: Discharge home Labor precautions and fetal kick counts Follow up in Office for prenatal visit as scheduled Encouraged to return here or to other Urgent Care/ED if she develops worsening of symptoms, increase in pain, fever, or other concerning symptoms.  Pt stable at time of discharge.  Hansel Feinstein CNM, MSN Certified Nurse-Midwife 07/03/2019 12:45 AM

## 2019-07-04 ENCOUNTER — Encounter (HOSPITAL_COMMUNITY): Payer: Self-pay | Admitting: Obstetrics and Gynecology

## 2019-07-04 DIAGNOSIS — O4703 False labor before 37 completed weeks of gestation, third trimester: Secondary | ICD-10-CM | POA: Diagnosis not present

## 2019-07-04 NOTE — MAU Provider Note (Signed)
S: Ms. Kelli Perkins is a 30 y.o. G2P0010 at [redacted]w[redacted]d  who presents to MAU today for labor evaluation.    RN Note: Pt had cerclage removed today about 1430 and tol well. Cervix was 2cm then. Having ctxs tonight and slight spotting. Was here last night for ctxs. Vitals:   07/03/19 2356 07/03/19 2359 07/04/19 0020 07/04/19 0118  BP:  123/84 139/86 130/86  Pulse:  67 65 63  Resp: 18   16  Temp: 98.5 F (36.9 C)     SpO2:    100%  Weight: 100.7 kg     Height: 5\' 6"  (1.676 m)     Had full workup for gestational hypertension last night.   Cervical exam by RN:  Dilation: 2 Effacement (%): 90 Cervical Position: Anterior Station: -1 Presentation: Vertex Exam by:: Holly Flippin RN  No change over time  Fetal Monitoring: Baseline: 140 Variability: average Accelerations: present Decelerations: absent Contractions: irregular  MDM Discussed patient with RN. NST reviewed.   A: SIUP at [redacted]w[redacted]d  False labor  P: Discharge home Labor precautions and kick counts included in AVS Patient to follow-up with office as scheduled  Patient may return to MAU as needed or when in labor   [redacted]w[redacted]d, CNM 07/04/2019 1:20 AM

## 2019-07-04 NOTE — Discharge Instructions (Signed)

## 2019-07-05 ENCOUNTER — Other Ambulatory Visit: Payer: Self-pay

## 2019-07-05 ENCOUNTER — Encounter (HOSPITAL_COMMUNITY): Payer: Self-pay | Admitting: Obstetrics and Gynecology

## 2019-07-05 ENCOUNTER — Inpatient Hospital Stay (HOSPITAL_COMMUNITY)
Admission: AD | Admit: 2019-07-05 | Discharge: 2019-07-05 | Disposition: A | Discharge status: Home / Self Care | Payer: BC Managed Care – PPO | Attending: Obstetrics and Gynecology | Admitting: Obstetrics and Gynecology

## 2019-07-05 DIAGNOSIS — Z3A37 37 weeks gestation of pregnancy: Secondary | ICD-10-CM | POA: Diagnosis not present

## 2019-07-05 DIAGNOSIS — Z3689 Encounter for other specified antenatal screening: Secondary | ICD-10-CM

## 2019-07-05 DIAGNOSIS — O471 False labor at or after 37 completed weeks of gestation: Secondary | ICD-10-CM

## 2019-07-05 DIAGNOSIS — O26893 Other specified pregnancy related conditions, third trimester: Secondary | ICD-10-CM | POA: Insufficient documentation

## 2019-07-05 LAB — COMPREHENSIVE METABOLIC PANEL
ALT: 23 U/L (ref 0–44)
AST: 20 U/L (ref 15–41)
Albumin: 2.9 g/dL — ABNORMAL LOW (ref 3.5–5.0)
Alkaline Phosphatase: 107 U/L (ref 38–126)
Anion gap: 12 (ref 5–15)
BUN: 12 mg/dL (ref 6–20)
CO2: 20 mmol/L — ABNORMAL LOW (ref 22–32)
Calcium: 9.4 mg/dL (ref 8.9–10.3)
Chloride: 105 mmol/L (ref 98–111)
Creatinine, Ser: 0.73 mg/dL (ref 0.44–1.00)
GFR calc Af Amer: >60 mL/min (ref >60)
GFR calc non Af Amer: >60 mL/min (ref >60)
Glucose, Bld: 78 mg/dL (ref 70–99)
Potassium: 3.8 mmol/L (ref 3.5–5.1)
Sodium: 137 mmol/L (ref 135–145)
Total Bilirubin: 0.6 mg/dL (ref 0.3–1.2)
Total Protein: 6.3 g/dL — ABNORMAL LOW (ref 6.5–8.1)

## 2019-07-05 LAB — CBC
HCT: 37.6 % (ref 36.0–46.0)
Hemoglobin: 12 g/dL (ref 12.0–15.0)
MCH: 28.3 pg (ref 26.0–34.0)
MCHC: 31.9 g/dL (ref 30.0–36.0)
MCV: 88.7 fL (ref 80.0–100.0)
Platelets: 182 10*3/uL (ref 150–400)
RBC: 4.24 MIL/uL (ref 3.87–5.11)
RDW: 13.9 % (ref 11.5–15.5)
WBC: 9.2 10*3/uL (ref 4.0–10.5)
nRBC: 0 % (ref 0.0–0.2)

## 2019-07-05 LAB — AMNISURE RUPTURE OF MEMBRANE (ROM) NOT AT ARMC: Amnisure ROM: NEGATIVE

## 2019-07-05 NOTE — MAU Provider Note (Addendum)
S: Ms. Kelli Perkins is a 29 y.o. G2P0010 at [redacted]w[redacted]d  who presents to MAU today complaining of leaking of fluid since this morning. She denies vaginal bleeding. She endorses contractions. She reports normal fetal movement.    O: BP 131/85   Pulse 69   Wt 100.7 kg   LMP 09/21/2018   SpO2 99%   BMI 35.82 kg/m    Patient Vitals for the past 24 hrs:  BP Temp src Pulse SpO2 Weight  07/05/19 2046 131/85 -- 69 -- --  07/05/19 2031 135/77 -- 60 -- --  07/05/19 2016 140/79 -- (!) 58 -- --  07/05/19 2001 (!) 146/85 -- 63 -- --  07/05/19 1946 136/85 -- 76 -- --  07/05/19 1943 129/77 -- 67 -- --  07/05/19 1923 129/78 -- 66 -- --  07/05/19 1904 -- Oral -- 99 % 100.7 kg    GENERAL: Well-developed, well-nourished female in no acute distress.  HEAD: Normocephalic, atraumatic.  CHEST: Normal effort of breathing, regular heart rate ABDOMEN: Soft, nontender, gravid PELVIC: Normal external female genitalia. Vagina is pink and rugated. Cervix with normal contour, no lesions. Normal discharge.  No pooling. Minute bright red bleeding on exam, appears to be coming from location of cervical cerclage, not from external os, which patient reports was removed yesterday. Small blue string, likely from cerclage, present in vagina and incidentally removed on gloved hand after cervical exam.  Cervical exam:  Dilation: 3 Effacement (%): 70 Station: 0 Presentation: Vertex Exam by:: Vernice Jefferson NP    Fetal Monitoring: reactive Baseline: 120 Variability: moderate Accelerations: present, 15x15 Decelerations: present few variables Contractions: present  Results for orders placed or performed during the hospital encounter of 07/05/19 (from the past 24 hour(s))  Amnisure rupture of membrane (rom)not at [facility]     Status: None   Collection Time: 07/05/19  8:16 PM  Result Value Ref Range   Amnisure ROM NEGATIVE     A: SIUP at [redacted]w[redacted]d  Membranes intact  P: RN to recheck in two hours for cervical change in  presence of contractions Care transferred to [facility] to evaluate tracing, cervical change, bleeding.  Nugent, Gerrie Nordmann, NP  8:50 PM 07/05/2019  Reassessment (10:33 PM)  Results for orders placed or performed during the hospital encounter of 07/05/19 (from the past 24 hour(s))  Amnisure rupture of membrane (rom)not at [facility]     Status: None   Collection Time: 07/05/19  8:16 PM  Result Value Ref Range   Amnisure ROM NEGATIVE   CBC     Status: None   Collection Time: 07/05/19  9:09 PM  Result Value Ref Range   WBC 9.2 4.0 - 10.5 K/uL   RBC 4.24 3.87 - 5.11 MIL/uL   Hemoglobin 12.0 12.0 - 15.0 g/dL   HCT 37.6 36.0 - 46.0 %   MCV 88.7 80.0 - 100.0 fL   MCH 28.3 26.0 - 34.0 pg   MCHC 31.9 30.0 - 36.0 g/dL   RDW 13.9 11.5 - 15.5 %   Platelets 182 150 - 400 K/uL   nRBC 0.0 0.0 - 0.2 %  Comprehensive metabolic panel     Status: Abnormal   Collection Time: 07/05/19  9:09 PM  Result Value Ref Range   Sodium 137 135 - 145 mmol/L   Potassium 3.8 3.5 - 5.1 mmol/L   Chloride 105 98 - 111 mmol/L   CO2 20 (L) 22 - 32 mmol/L   Glucose, Bld 78 70 - 99 mg/dL  BUN 12 6 - 20 mg/dL   Creatinine, Ser 0.37 0.44 - 1.00 mg/dL   Calcium 9.4 8.9 - 54.3 mg/dL   Total Protein 6.3 (L) 6.5 - 8.1 g/dL   Albumin 2.9 (L) 3.5 - 5.0 g/dL   AST 20 15 - 41 U/L   ALT 23 0 - 44 U/L   Alkaline Phosphatase 107 38 - 126 U/L   Total Bilirubin 0.6 0.3 - 1.2 mg/dL   GFR calc non Af Amer >60 >60 mL/min   GFR calc Af Amer >60 >60 mL/min   Anion gap 12 5 - 15  Protein / creatinine ratio, urine     Status: None (Preliminary result)   Collection Time: 07/05/19  9:58 PM  Result Value Ref Range   Creatinine, Urine 87.52 mg/dL   Total Protein, Urine PENDING mg/dL   Protein Creatinine Ratio        0.00 - 0.15 mg/mg[Cre]   -Cervical Exam remains the same per nurse.  -Dr. Nelta Numbers in building and informed of patient status. -States that patient can follow up on Monday in office. -Will discharge to  home. -Encouraged to call or return to MAU if symptoms worsen or with the onset of new symptoms. -Discharged to home in stable condition.  Cherre Robins MSN, CNM Advanced Practice Provider, Center for [employer]

## 2019-07-05 NOTE — MAU Note (Signed)
Patient reports to MAU stating around lunch time she lost her mucous plug and since then she has had to change her underwear frequently due to them getting wet. Pt is unsure if her water is broke. Pt reports occasional ctx. No bleeding. +FM.

## 2019-07-06 LAB — PROTEIN / CREATININE RATIO, URINE
Creatinine, Urine: 87.52 mg/dL
Total Protein, Urine: <6 mg/dL

## 2019-07-09 DIAGNOSIS — Z3A37 37 weeks gestation of pregnancy: Secondary | ICD-10-CM | POA: Diagnosis not present

## 2019-07-09 DIAGNOSIS — O24419 Gestational diabetes mellitus in pregnancy, unspecified control: Secondary | ICD-10-CM | POA: Diagnosis not present

## 2019-07-11 ENCOUNTER — Inpatient Hospital Stay (HOSPITAL_COMMUNITY): Payer: BC Managed Care – PPO | Admitting: Anesthesiology

## 2019-07-11 ENCOUNTER — Other Ambulatory Visit: Payer: Self-pay

## 2019-07-11 ENCOUNTER — Inpatient Hospital Stay (EMERGENCY_DEPARTMENT_HOSPITAL)
Admission: AD | Admit: 2019-07-11 | Discharge: 2019-07-11 | Disposition: A | Discharge status: Home / Self Care | Payer: BC Managed Care – PPO | Source: Home / Self Care | Attending: Obstetrics and Gynecology | Admitting: Obstetrics and Gynecology

## 2019-07-11 ENCOUNTER — Inpatient Hospital Stay (HOSPITAL_COMMUNITY)
Admission: AD | Admit: 2019-07-11 | Discharge: 2019-07-13 | DRG: 768 | Disposition: A | Discharge status: Home / Self Care | Payer: BC Managed Care – PPO | Attending: Obstetrics and Gynecology | Admitting: Obstetrics and Gynecology

## 2019-07-11 ENCOUNTER — Encounter (HOSPITAL_COMMUNITY): Payer: Self-pay | Admitting: Obstetrics and Gynecology

## 2019-07-11 DIAGNOSIS — Z3A37 37 weeks gestation of pregnancy: Secondary | ICD-10-CM

## 2019-07-11 DIAGNOSIS — Z20822 Contact with and (suspected) exposure to covid-19: Secondary | ICD-10-CM | POA: Diagnosis present

## 2019-07-11 DIAGNOSIS — O99824 Streptococcus B carrier state complicating childbirth: Secondary | ICD-10-CM | POA: Diagnosis present

## 2019-07-11 DIAGNOSIS — S3763XA Laceration of uterus, initial encounter: Secondary | ICD-10-CM | POA: Diagnosis not present

## 2019-07-11 DIAGNOSIS — O2243 Hemorrhoids in pregnancy, third trimester: Secondary | ICD-10-CM | POA: Diagnosis not present

## 2019-07-11 DIAGNOSIS — O479 False labor, unspecified: Secondary | ICD-10-CM

## 2019-07-11 DIAGNOSIS — O24410 Gestational diabetes mellitus in pregnancy, diet controlled: Secondary | ICD-10-CM | POA: Diagnosis present

## 2019-07-11 DIAGNOSIS — O133 Gestational [pregnancy-induced] hypertension without significant proteinuria, third trimester: Secondary | ICD-10-CM | POA: Diagnosis not present

## 2019-07-11 DIAGNOSIS — O24420 Gestational diabetes mellitus in childbirth, diet controlled: Secondary | ICD-10-CM | POA: Diagnosis not present

## 2019-07-11 DIAGNOSIS — O26893 Other specified pregnancy related conditions, third trimester: Secondary | ICD-10-CM | POA: Diagnosis not present

## 2019-07-11 DIAGNOSIS — O3433 Maternal care for cervical incompetence, third trimester: Secondary | ICD-10-CM | POA: Diagnosis not present

## 2019-07-11 LAB — CBC
HCT: 36.2 % (ref 36.0–46.0)
HCT: 38.1 % (ref 36.0–46.0)
Hemoglobin: 11.6 g/dL — ABNORMAL LOW (ref 12.0–15.0)
Hemoglobin: 12.2 g/dL (ref 12.0–15.0)
MCH: 28.5 pg (ref 26.0–34.0)
MCH: 28.8 pg (ref 26.0–34.0)
MCHC: 32 g/dL (ref 30.0–36.0)
MCHC: 32 g/dL (ref 30.0–36.0)
MCV: 88.9 fL (ref 80.0–100.0)
MCV: 90.1 fL (ref 80.0–100.0)
Platelets: 187 10*3/uL (ref 150–400)
Platelets: 200 10*3/uL (ref 150–400)
RBC: 4.07 MIL/uL (ref 3.87–5.11)
RBC: 4.23 MIL/uL (ref 3.87–5.11)
RDW: 14 % (ref 11.5–15.5)
RDW: 14.1 % (ref 11.5–15.5)
WBC: 8.2 10*3/uL (ref 4.0–10.5)
WBC: 10.4 10*3/uL (ref 4.0–10.5)
nRBC: 0 % (ref 0.0–0.2)
nRBC: 0 % (ref 0.0–0.2)

## 2019-07-11 LAB — PROTEIN / CREATININE RATIO, URINE
Creatinine, Urine: 34.92 mg/dL
Total Protein, Urine: <6 mg/dL

## 2019-07-11 LAB — TYPE AND SCREEN
ABO/RH(D): O POS
Antibody Screen: NEGATIVE

## 2019-07-11 LAB — COMPREHENSIVE METABOLIC PANEL
ALT: 25 U/L (ref 0–44)
AST: 19 U/L (ref 15–41)
Albumin: 2.8 g/dL — ABNORMAL LOW (ref 3.5–5.0)
Alkaline Phosphatase: 108 U/L (ref 38–126)
Anion gap: 10 (ref 5–15)
BUN: 8 mg/dL (ref 6–20)
CO2: 22 mmol/L (ref 22–32)
Calcium: 8.8 mg/dL — ABNORMAL LOW (ref 8.9–10.3)
Chloride: 104 mmol/L (ref 98–111)
Creatinine, Ser: 0.69 mg/dL (ref 0.44–1.00)
GFR calc Af Amer: >60 mL/min (ref >60)
GFR calc non Af Amer: >60 mL/min (ref >60)
Glucose, Bld: 88 mg/dL (ref 70–99)
Potassium: 4 mmol/L (ref 3.5–5.1)
Sodium: 136 mmol/L (ref 135–145)
Total Bilirubin: 0.4 mg/dL (ref 0.3–1.2)
Total Protein: 5.9 g/dL — ABNORMAL LOW (ref 6.5–8.1)

## 2019-07-11 LAB — GLUCOSE, CAPILLARY
Glucose-Capillary: 88 mg/dL (ref 70–99)
Glucose-Capillary: 78 mg/dL (ref 70–99)

## 2019-07-11 LAB — RESPIRATORY PANEL BY RT PCR (FLU A&B, COVID)
Influenza A by PCR: NEGATIVE
Influenza B by PCR: NEGATIVE
SARS Coronavirus 2 by RT PCR: NEGATIVE

## 2019-07-11 LAB — RPR: RPR Ser Ql: NONREACTIVE

## 2019-07-11 LAB — POCT FERN TEST: POCT Fern Test: POSITIVE

## 2019-07-11 MED ORDER — PENICILLIN G POT IN DEXTROSE 60000 UNIT/ML IV SOLN
3.0000 10*6.[IU] | INTRAVENOUS | Status: DC
  Administered 2019-07-11: 3 10*6.[IU] via INTRAVENOUS
  Filled 2019-07-11: qty 50

## 2019-07-11 MED ORDER — ONDANSETRON HCL 4 MG/2ML IJ SOLN: 4.0000 mg | INTRAMUSCULAR | Status: DC | PRN

## 2019-07-11 MED ORDER — BENZOCAINE-MENTHOL 20-0.5 % EX AERO
1.0000 "application " | INHALATION_SPRAY | CUTANEOUS | Status: DC | PRN
  Administered 2019-07-11: 1 via TOPICAL
  Filled 2019-07-11: qty 56

## 2019-07-11 MED ORDER — SOD CITRATE-CITRIC ACID 500-334 MG/5ML PO SOLN: 30.0000 mL | ORAL | Status: DC | PRN

## 2019-07-11 MED ORDER — LACTATED RINGERS IV SOLN
500.0000 mL | Freq: Once | INTRAVENOUS | Status: AC
  Administered 2019-07-11: 500 mL via INTRAVENOUS

## 2019-07-11 MED ORDER — METHYLERGONOVINE MALEATE 0.2 MG/ML IJ SOLN: 0.2000 mg | INTRAMUSCULAR | Status: DC | PRN

## 2019-07-11 MED ORDER — PHENYLEPHRINE 40 MCG/ML (10ML) SYRINGE FOR IV PUSH (FOR BLOOD PRESSURE SUPPORT): 80.0000 ug | PREFILLED_SYRINGE | INTRAVENOUS | Status: DC | PRN

## 2019-07-11 MED ORDER — ZOLPIDEM TARTRATE 5 MG PO TABS: 5.0000 mg | ORAL_TABLET | Freq: Every evening | ORAL | Status: DC | PRN

## 2019-07-11 MED ORDER — PRENATAL MULTIVITAMIN CH
1.0000 | ORAL_TABLET | Freq: Every day | ORAL | Status: DC
  Administered 2019-07-12 – 2019-07-13 (×2): 1 via ORAL
  Filled 2019-07-11 (×2): qty 1

## 2019-07-11 MED ORDER — SODIUM CHLORIDE (PF) 0.9 % IJ SOLN
INTRAMUSCULAR | Status: DC | PRN
  Administered 2019-07-11: 12 mL/h via EPIDURAL

## 2019-07-11 MED ORDER — OXYCODONE-ACETAMINOPHEN 5-325 MG PO TABS: 2.0000 | ORAL_TABLET | ORAL | Status: DC | PRN

## 2019-07-11 MED ORDER — WITCH HAZEL-GLYCERIN EX PADS: 1.0000 "application " | MEDICATED_PAD | CUTANEOUS | Status: DC | PRN

## 2019-07-11 MED ORDER — FLEET ENEMA 7-19 GM/118ML RE ENEM: 1.0000 | ENEMA | RECTAL | Status: DC | PRN

## 2019-07-11 MED ORDER — ONDANSETRON HCL 4 MG/2ML IJ SOLN: 4.0000 mg | Freq: Four times a day (QID) | INTRAMUSCULAR | Status: DC | PRN

## 2019-07-11 MED ORDER — EPHEDRINE 5 MG/ML INJ: 10.0000 mg | INTRAVENOUS | Status: DC | PRN

## 2019-07-11 MED ORDER — SODIUM CHLORIDE 0.9 % IV SOLN
5.0000 10*6.[IU] | Freq: Once | INTRAVENOUS | Status: AC
  Administered 2019-07-11: 5 10*6.[IU] via INTRAVENOUS
  Filled 2019-07-11: qty 5

## 2019-07-11 MED ORDER — TERBUTALINE SULFATE 1 MG/ML IJ SOLN: 0.2500 mg | Freq: Once | INTRAMUSCULAR | Status: DC | PRN

## 2019-07-11 MED ORDER — FENTANYL-BUPIVACAINE-NACL 0.5-0.125-0.9 MG/250ML-% EP SOLN
12.0000 mL/h | EPIDURAL | Status: DC | PRN
  Filled 2019-07-11: qty 250

## 2019-07-11 MED ORDER — DIBUCAINE (PERIANAL) 1 % EX OINT: 1.0000 "application " | TOPICAL_OINTMENT | CUTANEOUS | Status: DC | PRN

## 2019-07-11 MED ORDER — LIDOCAINE HCL (PF) 1 % IJ SOLN
30.0000 mL | INTRAMUSCULAR | Status: AC | PRN
  Administered 2019-07-11 (×2): 5 mL via SUBCUTANEOUS

## 2019-07-11 MED ORDER — ACETAMINOPHEN 325 MG PO TABS: 650.0000 mg | ORAL_TABLET | ORAL | Status: DC | PRN

## 2019-07-11 MED ORDER — OXYCODONE-ACETAMINOPHEN 5-325 MG PO TABS: 1.0000 | ORAL_TABLET | ORAL | Status: DC | PRN

## 2019-07-11 MED ORDER — SENNOSIDES-DOCUSATE SODIUM 8.6-50 MG PO TABS
2.0000 | ORAL_TABLET | ORAL | Status: DC
  Administered 2019-07-11 – 2019-07-13 (×2): 2 via ORAL
  Filled 2019-07-11 (×2): qty 2

## 2019-07-11 MED ORDER — ACETAMINOPHEN 325 MG PO TABS
650.0000 mg | ORAL_TABLET | ORAL | Status: DC | PRN
  Administered 2019-07-11: 650 mg via ORAL
  Filled 2019-07-11: qty 2

## 2019-07-11 MED ORDER — DIPHENHYDRAMINE HCL 25 MG PO CAPS: 25.0000 mg | ORAL_CAPSULE | Freq: Four times a day (QID) | ORAL | Status: DC | PRN

## 2019-07-11 MED ORDER — LACTATED RINGERS IV SOLN
500.0000 mL | INTRAVENOUS | Status: DC | PRN
  Administered 2019-07-11: 500 mL via INTRAVENOUS

## 2019-07-11 MED ORDER — ONDANSETRON HCL 4 MG PO TABS: 4.0000 mg | ORAL_TABLET | ORAL | Status: DC | PRN

## 2019-07-11 MED ORDER — OXYTOCIN 40 UNITS IN NORMAL SALINE INFUSION - SIMPLE MED
1.0000 m[IU]/min | INTRAVENOUS | Status: DC
  Administered 2019-07-11: 2 m[IU]/min via INTRAVENOUS
  Filled 2019-07-11: qty 1000

## 2019-07-11 MED ORDER — SIMETHICONE 80 MG PO CHEW: 80.0000 mg | CHEWABLE_TABLET | ORAL | Status: DC | PRN

## 2019-07-11 MED ORDER — IBUPROFEN 600 MG PO TABS
600.0000 mg | ORAL_TABLET | Freq: Four times a day (QID) | ORAL | Status: DC
  Administered 2019-07-11 – 2019-07-13 (×8): 600 mg via ORAL
  Filled 2019-07-11 (×8): qty 1

## 2019-07-11 MED ORDER — DIPHENHYDRAMINE HCL 50 MG/ML IJ SOLN: 12.5000 mg | INTRAMUSCULAR | Status: DC | PRN

## 2019-07-11 MED ORDER — LACTATED RINGERS IV SOLN: INTRAVENOUS | Status: DC

## 2019-07-11 MED ORDER — METHYLERGONOVINE MALEATE 0.2 MG PO TABS: 0.2000 mg | ORAL_TABLET | ORAL | Status: DC | PRN

## 2019-07-11 MED ORDER — COCONUT OIL OIL: 1.0000 "application " | TOPICAL_OIL | Status: DC | PRN

## 2019-07-11 MED ORDER — OXYTOCIN 40 UNITS IN NORMAL SALINE INFUSION - SIMPLE MED: 2.5000 [IU]/h | INTRAVENOUS | Status: DC

## 2019-07-11 MED ORDER — OXYTOCIN BOLUS FROM INFUSION
500.0000 mL | Freq: Once | INTRAVENOUS | Status: AC
  Administered 2019-07-11: 500 mL via INTRAVENOUS

## 2019-07-11 MED ORDER — TETANUS-DIPHTH-ACELL PERTUSSIS 5-2.5-18.5 LF-MCG/0.5 IM SUSP: 0.5000 mL | Freq: Once | INTRAMUSCULAR | Status: DC

## 2019-07-11 NOTE — MAU Note (Signed)
Pt reports water broke about 15 mins prior to arrival, clear fluid. Was recently discharged from MAU this morning after being evaluated for contractions. Was 3cm prior to discharge.

## 2019-07-11 NOTE — Progress Notes (Signed)
Assisted patient to the bathroom to void for the first time.  While attempting to void, the patient became dizzy and felt like she was going to pass out.  Instructed to perform pursed lip breathing and to look forward.  After a few minutes the dizziness subsided and pt was taken back to the bed on the stedy.  Was informed that her mother was bringing food, crackers and water were given to her at the bedside.  Was instructed to call out for assistance when she needed to go to the restroom.

## 2019-07-11 NOTE — H&P (Signed)
Kelli Perkins is a 29 y.o. female presenting for SROM this am. OB History    Gravida  2   Para      Term      Preterm      AB  1   Living        SAB  1   TAB      Ectopic      Multiple      Live Births             Past Medical History:  Diagnosis Date  . Vaginal Pap smear, abnormal    Past Surgical History:  Procedure Laterality Date  . CERVICAL CERCLAGE N/A 03/07/2019   Procedure: CERCLAGE CERVICAL;  Surgeon: Olivia Mackie, MD;  Location: [facility] LD [facility]S;  Service: Gynecology;  Laterality: N/A;  EDD: 07/26/19  . NO PAST SURGERIES     Family History: family history includes Cancer in her father; Hypertension in her father. Social History:  reports that she has never smoked. She has never used smokeless tobacco. She reports that she does not drink alcohol or use drugs.     Maternal Diabetes: Yes:  Diabetes Type:  Diet controlled Genetic Screening: Normal Maternal Ultrasounds/Referrals: Normal Fetal Ultrasounds or other Referrals:  None Maternal Substance Abuse:  No Significant Maternal Medications:  None Significant Maternal Lab Results:  Group B Strep positive Other Comments:  None  Review of Systems  Constitutional: Negative.   All other systems reviewed and are negative.  Maternal Medical History:  Reason for admission: Rupture of membranes and contractions.   Contractions: Onset was 1-2 hours ago.   Frequency: irregular.   Perceived severity is mild.    Fetal activity: Perceived fetal activity is normal.   Last perceived fetal movement was within the past hour.    Prenatal complications: no prenatal complications Prenatal Complications - Diabetes: gestational. Diabetes is managed by diet.        Blood pressure 138/85, pulse 73, temperature 98.8 F (37.1 C), temperature source Oral, resp. rate 16, height 5\' 6"  (1.676 m), weight 100.7 kg, last menstrual period 09/21/2018. Maternal Exam:  Uterine Assessment: Contraction strength is mild.   Contraction frequency is irregular.   Abdomen: Patient reports no abdominal tenderness. Fetal presentation: vertex  Introitus: Normal vulva. Normal vagina.  Ferning test: positive.  Nitrazine test: positive. Amniotic fluid character: clear.  Pelvis: adequate for delivery.   Cervix: Cervix evaluated by digital exam.     Physical Exam  Nursing note and vitals reviewed. Constitutional: She is oriented to person, place, and time. She appears well-developed and well-nourished.  HENT:  Head: Normocephalic and atraumatic.  Cardiovascular: Normal rate and regular rhythm.  Respiratory: Breath sounds normal.  GI: Soft. Bowel sounds are normal.  Genitourinary:    Vulva, vagina and uterus normal.   Musculoskeletal:        General: Normal range of motion.     Cervical back: Normal range of motion and neck supple.  Neurological: She is alert and oriented to person, place, and time. She has normal reflexes.  Skin: Skin is warm and dry.  Psychiatric: She has a normal mood and affect.    Prenatal labs: ABO, Rh: --/--/O POS (03/12 0520) Antibody: NEG (03/12 0520) Rubella: Immune (08/21 0000) RPR: NON REACTIVE (11/05 1030)  HBsAg: Negative (08/21 0000)  HIV: Non-reactive (08/21 0000)  GBS: Positive/-- (03/01 0000)   Assessment/Plan: Term IUP SROM GDM stable GBS pos  Admit BS q 6 PCN Sadeel Fiddler J 07/11/2019, 6:32 AM

## 2019-07-11 NOTE — MAU Note (Signed)
Pt reports lower back and pelvic pressure. Pt feels like abdomen gets tight irregularly. Pain wakes her up out of sleep. Pt has increase in mucous discharge. Pt denies LOF or vaginal bleeding. Reports good fetal movement. Cervix was 3cm on last exam.

## 2019-07-11 NOTE — Anesthesia Preprocedure Evaluation (Signed)
Anesthesia Evaluation  Patient identified by MRN, date of birth, ID band Patient awake    Reviewed: Allergy & Precautions, H&P , NPO status , Patient's Chart, lab work & pertinent test results  History of Anesthesia Complications Negative for: history of anesthetic complications  Airway Mallampati: II  TM Distance: >3 FB Neck ROM: full    Dental no notable dental hx. (+) Teeth Intact   Pulmonary neg pulmonary ROS,    Pulmonary exam normal breath sounds clear to auscultation       Cardiovascular negative cardio ROS Normal cardiovascular exam Rhythm:regular Rate:Normal     Neuro/Psych negative neurological ROS  negative psych ROS   GI/Hepatic negative GI ROS, Neg liver ROS,   Endo/Other  negative endocrine ROS  Renal/GU negative Renal ROS     Musculoskeletal negative musculoskeletal ROS (+)   Abdominal (+) + obese,   Peds  Hematology negative hematology ROS (+)   Anesthesia Other Findings   Reproductive/Obstetrics (+) Pregnancy                             Anesthesia Physical Anesthesia Plan  ASA: II  Anesthesia Plan: Epidural   Post-op Pain Management:    Induction:   PONV Risk Score and Plan:   Airway Management Planned:   Additional Equipment:   Intra-op Plan:   Post-operative Plan:   Informed Consent: I have reviewed the patients History and Physical, chart, labs and discussed the procedure including the risks, benefits and alternatives for the proposed anesthesia with the patient or authorized representative who has indicated his/her understanding and acceptance.       Plan Discussed with:   Anesthesia Plan Comments:         Anesthesia Quick Evaluation

## 2019-07-11 NOTE — MAU Provider Note (Signed)
Chief Complaint:  Labor Eval   First Provider Initiated Contact with Patient 07/11/19 0102     HPI: Kelli Perkins is a 29 y.o. G2P0010 at 2w6dwho presents to maternity admissions reporting low back pain and pressure in pelvis.  Worried it is labor and she will deliver at home.  Pain woke her up.  . She reports good fetal movement, denies LOF, vaginal bleeding, vaginal itching/burning, urinary symptoms, h/a, dizziness, n/v, diarrhea, constipation or fever/chills.  She denies headache, visual changes or RUQ abdominal pain.  Back Pain This is a recurrent problem. The current episode started today. The problem occurs intermittently. The problem is unchanged. The pain is present in the lumbar spine. Radiates to: pelvis. The pain is moderate. Associated symptoms include abdominal pain and pelvic pain. Pertinent negatives include no dysuria, fever, headaches, numbness, tingling or weakness. She has tried nothing for the symptoms.  Hypertension This is a recurrent problem. Progression since onset: Happens intermittently when monitored. Pertinent negatives include no anxiety, blurred vision, headaches, malaise/fatigue, peripheral edema or shortness of breath. There are no associated agents to hypertension. There are no known risk factors for coronary artery disease. Past treatments include nothing. There are no compliance problems.     RN note: Pt reports lower back and pelvic pressure. Pt feels like abdomen gets tight irregularly. Pain wakes her up out of sleep. Pt has increase in mucous discharge. Pt denies LOF or vaginal bleeding. Reports good fetal movement. Cervix was 3cm on last exam  Past Medical History: Past Medical History:  Diagnosis Date  . Vaginal Pap smear, abnormal     Past obstetric history: OB History  Gravida Para Term Preterm AB Living  2       1    SAB TAB Ectopic Multiple Live Births  1            # Outcome Date GA Lbr Len/2nd Weight Sex Delivery Anes PTL Lv  2 Current            1 SAB 07/2018 [redacted]w[redacted]d           Past Surgical History: Past Surgical History:  Procedure Laterality Date  . CERVICAL CERCLAGE N/A 03/07/2019   Procedure: CERCLAGE CERVICAL;  Surgeon: Olivia Mackie, MD;  Location: [facility]S;  Service: Gynecology;  Laterality: N/A;  EDD: 07/26/19  . NO PAST SURGERIES      Family History: Family History  Problem Relation Age of Onset  . Cancer Father   . Hypertension Father     Social History: Social History   Tobacco Use  . Smoking status: Never Smoker  . Smokeless tobacco: Never Used  Substance Use Topics  . Alcohol use: No    Alcohol/week: 0.0 standard drinks  . Drug use: No    Allergies: No Known Allergies  Meds:  Medications Prior to Admission  Medication Sig Dispense Refill Last Dose  . Prenatal Vit-Fe Fumarate-FA (PRENATAL MULTIVITAMIN) TABS tablet Take 1 tablet by mouth daily.    07/11/2019 at Unknown time  . acetaminophen (TYLENOL) 325 MG tablet Take 325 mg by mouth every 6 (six) hours as needed for moderate pain or headache.      . calcium carbonate (TUMS - DOSED IN MG ELEMENTAL CALCIUM) 500 MG chewable tablet Chew 1 tablet by mouth daily as needed for indigestion or heartburn.      . Doxylamine-Pyridoxine 10-10 MG TBEC Take 1 tablet by mouth daily as needed (nausea).      . progesterone (PROMETRIUM) 200 MG capsule  Place 200 mg vaginally at bedtime.       I have reviewed patient's Past Medical Hx, Surgical Hx, Family Hx, Social Hx, medications and allergies.   ROS:  Review of Systems  Constitutional: Negative for fever and malaise/fatigue.  Eyes: Negative for blurred vision.  Respiratory: Negative for shortness of breath.   Gastrointestinal: Positive for abdominal pain.  Genitourinary: Positive for pelvic pain. Negative for dysuria.  Musculoskeletal: Positive for back pain.  Neurological: Negative for tingling, weakness, numbness and headaches.   Other systems negative  Physical Exam   Patient Vitals for the past  24 hrs:  BP Temp Temp src Pulse Resp SpO2 Height Weight  07/11/19 0027 (!) 149/86 98.4 F (36.9 C) Oral 71 18 99 % 5\' 6"  (1.676 m) 100.7 kg   Vitals:   07/11/19 0042 07/11/19 0047 07/11/19 0100 07/11/19 0117  BP: (!) 141/91 125/79 116/77 124/81  Pulse: 72 73 66 71  Resp:      Temp:      TempSrc:      SpO2:      Weight:      Height:        Constitutional: Well-developed, well-nourished female in no acute distress.  Cardiovascular: normal rate and rhythm Respiratory: normal effort, clear to auscultation bilaterally GI: Abd soft, non-tender, gravid appropriate for gestational age.   No rebound or guarding. MS: Extremities nontender, no edema, normal ROM Neurologic: Alert and oriented x 4. DTRs 2+ GU: Neg CVAT.  PELVIC EXAM:  Dilation: 3 Effacement (%): 70 Station: -1 Exam by:: l cox rn This is unchanged from last exam  FHT:  Baseline 120 , moderate variability, accelerations present, no decelerations Contractions: q 3-6 mins Irregular     Labs: Results for orders placed or performed during the hospital encounter of 07/11/19 (from the past 24 hour(s))  Protein / creatinine ratio, urine     Status: None   Collection Time: 07/11/19 12:56 AM  Result Value Ref Range   Creatinine, Urine 34.92 mg/dL   Total Protein, Urine <6 mg/dL   Protein Creatinine Ratio        0.00 - 0.15 mg/mg[Cre]  CBC     Status: Abnormal   Collection Time: 07/11/19  1:11 AM  Result Value Ref Range   WBC 8.2 4.0 - 10.5 K/uL   RBC 4.07 3.87 - 5.11 MIL/uL   Hemoglobin 11.6 (L) 12.0 - 15.0 g/dL   HCT 36.2 36.0 - 46.0 %   MCV 88.9 80.0 - 100.0 fL   MCH 28.5 26.0 - 34.0 pg   MCHC 32.0 30.0 - 36.0 g/dL   RDW 14.0 11.5 - 15.5 %   Platelets 187 150 - 400 K/uL   nRBC 0.0 0.0 - 0.2 %  Comprehensive metabolic panel     Status: Abnormal   Collection Time: 07/11/19  1:11 AM  Result Value Ref Range   Sodium 136 135 - 145 mmol/L   Potassium 4.0 3.5 - 5.1 mmol/L   Chloride 104 98 - 111 mmol/L   CO2 22 22 -  32 mmol/L   Glucose, Bld 88 70 - 99 mg/dL   BUN 8 6 - 20 mg/dL   Creatinine, Ser 0.69 0.44 - 1.00 mg/dL   Calcium 8.8 (L) 8.9 - 10.3 mg/dL   Total Protein 5.9 (L) 6.5 - 8.1 g/dL   Albumin 2.8 (L) 3.5 - 5.0 g/dL   AST 19 15 - 41 U/L   ALT 25 0 - 44 U/L   Alkaline Phosphatase 108  38 - 126 U/L   Total Bilirubin 0.4 0.3 - 1.2 mg/dL   GFR calc non Af Amer >60 >60 mL/min   GFR calc Af Amer >60 >60 mL/min   Anion gap 10 5 - 15    --/--/O POS (11/05 1013)  Imaging:  No results found.  MAU Course/MDM: I have ordered labs and reviewed results. Labs remain normal   NST reviewed, reassuring with irregular mild contractions BPs labile, only two elevated, others low.  Treatments in MAU included EFM, preeclampsia workup.    Assessment: Single intrauterine pregnancy at [redacted]w[redacted]d Irregular contractions, not in labor Labile hypertension, technically meets criteria for Gestational Hypertension, but BPs very inconsistent No evidence of preeclampsia  Plan: Discharge home Labor precautions and fetal kick counts Follow up in Office for prenatal visits   Encouraged to return here or to other Urgent Care/ED if she develops worsening of symptoms, increase in pain, fever, or other concerning symptoms.   Pt stable at time of discharge.  Wynelle Bourgeois CNM, MSN Certified Nurse-Midwife 07/11/2019 1:02 AM

## 2019-07-11 NOTE — Discharge Instructions (Signed)
Hypertension During Pregnancy High blood pressure (hypertension) is when the force of blood pumping through the arteries is too strong. Arteries are blood vessels that carry blood from the heart throughout the body. Hypertension during pregnancy can be mild or severe. Severe hypertension during pregnancy (preeclampsia) is a medical emergency that requires prompt evaluation and treatment. Different types of hypertension can happen during pregnancy. These include:  Chronic hypertension. This happens when you had high blood pressure before you became pregnant, and it continues during the pregnancy. Hypertension that develops before you are [redacted] weeks pregnant and continues during the pregnancy is also called chronic hypertension. If you have chronic hypertension, it will not go away after you have your baby. You will need follow-up visits with your health care provider after you have your baby. Your doctor may want you to keep taking medicine for your blood pressure.  Gestational hypertension. This is hypertension that develops after the 20th week of pregnancy. Gestational hypertension usually goes away after you have your baby, but your health care provider will need to monitor your blood pressure to make sure that it is getting better.  Preeclampsia. This is severe hypertension during pregnancy. This can cause serious complications for you and your baby and can also cause complications for you after the delivery of your baby.  Postpartum preeclampsia. You may develop severe hypertension after giving birth. This usually occurs within 48 hours after childbirth but may occur up to 6 weeks after giving birth. This is rare. How does this affect me? Women who have hypertension during pregnancy have a greater chance of developing hypertension later in life or during future pregnancies. In some cases, hypertension during pregnancy can cause serious complications, such as:  Stroke.  Heart attack.  Injury to  other organs, such as kidneys, lungs, or liver.  Preeclampsia.  Convulsions or seizures.  Placental abruption. How does this affect my baby? Hypertension during pregnancy can affect your baby. Your baby may:  Be born early (prematurely).  Not weigh as much as he or she should at birth (low birth weight).  Not tolerate labor well, leading to an unplanned cesarean delivery. What are the risks? There are certain factors that make it more likely for you to develop hypertension during pregnancy. These include:  Having hypertension during a previous pregnancy.  Being overweight.  Being age 35 or older.  Being pregnant for the first time.  Being pregnant with more than one baby.  Becoming pregnant using fertilization methods, such as IVF (in vitro fertilization).  Having other medical problems, such as diabetes, kidney disease, or lupus.  Having a family history of hypertension. What can I do to lower my risk? The exact cause of hypertension during pregnancy is not known. You may be able to lower your risk by:  Maintaining a healthy weight.  Eating a healthy and balanced diet.  Following your health care provider's instructions about treating any long-term conditions that you had before becoming pregnant. It is very important to keep all of your prenatal care appointments. Your health care provider will check your blood pressure and make sure that your pregnancy is progressing as expected. If a problem is found, early treatment can prevent complications. How is this treated? Treatment for hypertension during pregnancy varies depending on the type of hypertension you have and how serious it is.  If you were taking medicine for high blood pressure before you became pregnant, talk with your health care provider. You may need to change medicine during pregnancy because   some medicines, like ACE inhibitors, may not be considered safe for your baby.  If you have gestational  hypertension, your health care provider may order medicine to treat this during pregnancy.  If you are at risk for preeclampsia, your health care provider may recommend that you take a low-dose aspirin during your pregnancy.  If you have severe hypertension, you may need to be hospitalized so you and your baby can be monitored closely. You may also need to be given medicine to lower your blood pressure. This medicine may be given by mouth or through an IV.  In some cases, if your condition gets worse, you may need to deliver your baby early. Follow these instructions at home: Eating and drinking   Drink enough fluid to keep your urine pale yellow.  Avoid caffeine. Lifestyle  Do not use any products that contain nicotine or tobacco, such as cigarettes, e-cigarettes, and chewing tobacco. If you need help quitting, ask your health care provider.  Do not use alcohol or drugs.  Avoid stress as much as possible.  Rest and get plenty of sleep.  Regular exercise can help to reduce your blood pressure. Ask your health care provider what kinds of exercise are best for you. General instructions  Take over-the-counter and prescription medicines only as told by your health care provider.  Keep all prenatal and follow-up visits as told by your health care provider. This is important. Contact a health care provider if:  You have symptoms that your health care provider told you may require more treatment or monitoring, such as: ? Headaches. ? Nausea or vomiting. ? Abdominal pain. ? Dizziness. ? Light-headedness. Get help right away if:  You have: ? Severe abdominal pain that does not get better with treatment. ? A severe headache that does not get better. ? Vomiting that does not get better. ? Sudden, rapid weight gain. ? Sudden swelling in your hands, ankles, or face. ? Vaginal bleeding. ? Blood in your urine. ? Blurred or double vision. ? Shortness of breath or chest  pain. ? Weakness on one side of your body. ? Difficulty speaking.  Your baby is not moving as much as usual. Summary  High blood pressure (hypertension) is when the force of blood pumping through the arteries is too strong.  Hypertension during pregnancy can cause problems for you and your baby.  Treatment for hypertension during pregnancy varies depending on the type of hypertension you have and how serious it is.  Keep all prenatal and follow-up visits as told by your health care provider. This is important. This information is not intended to replace advice given to you by your health care provider. Make sure you discuss any questions you have with your health care provider. Document Revised: 08/08/2018 Document Reviewed: 05/14/2018 Elsevier Patient Education  2020 [employer]  Vaginal Delivery  Vaginal delivery means that you give birth by pushing your baby out of your birth canal (vagina). A team of health care providers will help you before, during, and after vaginal delivery. Birth experiences are unique for every woman and every pregnancy, and birth experiences vary depending on where you choose to give birth. What happens when I arrive at the birth center or hospital? Once you are in labor and have been admitted into the hospital or birth center, your health care provider may:  Review your pregnancy history and any concerns that you have.  Insert an IV into one of your veins. This may be used to give you  fluids and medicines.  Check your blood pressure, pulse, temperature, and heart rate (vital signs).  Check whether your bag of water (amniotic sac) has broken (ruptured).  Talk with you about your birth plan and discuss pain control options. Monitoring Your health care provider may monitor your contractions (uterine monitoring) and your baby's heart rate (fetal monitoring). You may need to be monitored:  Often, but not continuously (intermittently).  All the time or  for long periods at a time (continuously). Continuous monitoring may be needed if: ? You are taking certain medicines, such as medicine to relieve pain or make your contractions stronger. ? You have pregnancy or labor complications. Monitoring may be done by:  Placing a special stethoscope or a handheld monitoring device on your abdomen to check your baby's heartbeat and to check for contractions.  Placing monitors on your abdomen (external monitors) to record your baby's heartbeat and the frequency and length of contractions.  Placing monitors inside your uterus through your vagina (internal monitors) to record your baby's heartbeat and the frequency, length, and strength of your contractions. Depending on the type of monitor, it may remain in your uterus or on your baby's head until birth.  Telemetry. This is a type of continuous monitoring that can be done with external or internal monitors. Instead of having to stay in bed, you are able to move around during telemetry. Physical exam Your health care provider may perform frequent physical exams. This may include:  Checking how and where your baby is positioned in your uterus.  Checking your cervix to determine: ? Whether it is thinning out (effacing). ? Whether it is opening up (dilating). What happens during labor and delivery?  Normal labor and delivery is divided into the following three stages: Stage 1  This is the longest stage of labor.  This stage can last for hours or days.  Throughout this stage, you will feel contractions. Contractions generally feel mild, infrequent, and irregular at first. They get stronger, more frequent (about every 2-3 minutes), and more regular as you move through this stage.  This stage ends when your cervix is completely dilated to 4 inches (10 cm) and completely effaced. Stage 2  This stage starts once your cervix is completely effaced and dilated and lasts until the delivery of your  baby.  This stage may last from 20 minutes to 2 hours.  This is the stage where you will feel an urge to push your baby out of your vagina.  You may feel stretching and burning pain, especially when the widest part of your baby's head passes through the vaginal opening (crowning).  Once your baby is delivered, the umbilical cord will be clamped and cut. This usually occurs after waiting a period of 1-2 minutes after delivery.  Your baby will be placed on your bare chest (skin-to-skin contact) in an upright position and covered with a warm blanket. Watch your baby for feeding cues, like rooting or sucking, and help the baby to your breast for his or her first feeding. Stage 3  This stage starts immediately after the birth of your baby and ends after you deliver the placenta.  This stage may take anywhere from 5 to 30 minutes.  After your baby has been delivered, you will feel contractions as your body expels the placenta and your uterus contracts to control bleeding. What can I expect after labor and delivery?  After labor is over, you and your baby will be monitored closely until you are  ready to go home to ensure that you are both healthy. Your health care team will teach you how to care for yourself and your baby.  You and your baby will stay in the same room (rooming in) during your hospital stay. This will encourage early bonding and successful breastfeeding.  You may continue to receive fluids and medicines through an IV.  Your uterus will be checked and massaged regularly (fundal massage).  You will have some soreness and pain in your abdomen, vagina, and the area of skin between your vaginal opening and your anus (perineum).  If an incision was made near your vagina (episiotomy) or if you had some vaginal tearing during delivery, cold compresses may be placed on your episiotomy or your tear. This helps to reduce pain and swelling.  You may be given a squirt bottle to use  instead of wiping when you go to the bathroom. To use the squirt bottle, follow these steps: ? Before you urinate, fill the squirt bottle with warm water. Do not use hot water. ? After you urinate, while you are sitting on the toilet, use the squirt bottle to rinse the area around your urethra and vaginal opening. This rinses away any urine and blood. ? Fill the squirt bottle with clean water every time you use the bathroom.  It is normal to have vaginal bleeding after delivery. Wear a sanitary pad for vaginal bleeding and discharge. Summary  Vaginal delivery means that you will give birth by pushing your baby out of your birth canal (vagina).  Your health care provider may monitor your contractions (uterine monitoring) and your baby's heart rate (fetal monitoring).  Your health care provider may perform a physical exam.  Normal labor and delivery is divided into three stages.  After labor is over, you and your baby will be monitored closely until you are ready to go home. This information is not intended to replace advice given to you by your health care provider. Make sure you discuss any questions you have with your health care provider. Document Revised: 05/22/2017 Document Reviewed: 05/22/2017 Elsevier Patient Education  2020 [employer].

## 2019-07-11 NOTE — Anesthesia Procedure Notes (Signed)
Epidural Patient location during procedure: OB Start time: 07/11/2019 8:18 AM End time: 07/11/2019 8:28 AM  Staffing Anesthesiologist: Leonides Grills, MD Performed: anesthesiologist   Preanesthetic Checklist Completed: patient identified, IV checked, site marked, risks and benefits discussed, monitors and equipment checked, pre-op evaluation and timeout performed  Epidural Patient position: sitting Prep: DuraPrep Patient monitoring: heart rate, cardiac monitor, continuous pulse ox and blood pressure Approach: midline Location: L4-L5 Injection technique: LOR air  Needle:  Needle type: Tuohy  Needle gauge: 17 G Needle length: 9 cm Needle insertion depth: 7 cm Catheter type: closed end flexible Catheter size: 19 Gauge Catheter at skin depth: 12 cm Test dose: negative and Other  Assessment Events: blood not aspirated, injection not painful, no injection resistance and negative IV test  Additional Notes Informed consent obtained prior to proceeding including risk of failure, 1% risk of PDPH, risk of minor discomfort and bruising. Discussed alternatives to epidural analgesia and patient desires to proceed.  Timeout performed pre-procedure verifying patient name, procedure, and platelet count.  Patient tolerated procedure well. Reason for block:procedure for pain

## 2019-07-12 DIAGNOSIS — O24410 Gestational diabetes mellitus in pregnancy, diet controlled: Secondary | ICD-10-CM | POA: Diagnosis present

## 2019-07-12 DIAGNOSIS — S3763XA Laceration of uterus, initial encounter: Secondary | ICD-10-CM | POA: Diagnosis not present

## 2019-07-12 LAB — CBC
HCT: 31.2 % — ABNORMAL LOW (ref 36.0–46.0)
Hemoglobin: 9.9 g/dL — ABNORMAL LOW (ref 12.0–15.0)
MCH: 28.1 pg (ref 26.0–34.0)
MCHC: 31.7 g/dL (ref 30.0–36.0)
MCV: 88.6 fL (ref 80.0–100.0)
Platelets: 161 10*3/uL (ref 150–400)
RBC: 3.52 MIL/uL — ABNORMAL LOW (ref 3.87–5.11)
RDW: 14.4 % (ref 11.5–15.5)
WBC: 12.5 10*3/uL — ABNORMAL HIGH (ref 4.0–10.5)
nRBC: 0 % (ref 0.0–0.2)

## 2019-07-12 MED ORDER — MAGNESIUM OXIDE 400 (241.3 MG) MG PO TABS
400.0000 mg | ORAL_TABLET | Freq: Every day | ORAL | Status: DC
  Administered 2019-07-12 – 2019-07-13 (×2): 400 mg via ORAL
  Filled 2019-07-12 (×2): qty 1

## 2019-07-12 MED ORDER — POLYSACCHARIDE IRON COMPLEX 150 MG PO CAPS
150.0000 mg | ORAL_CAPSULE | Freq: Every day | ORAL | Status: DC
  Administered 2019-07-12 – 2019-07-13 (×2): 150 mg via ORAL
  Filled 2019-07-12 (×2): qty 1

## 2019-07-12 NOTE — Anesthesia Postprocedure Evaluation (Signed)
Anesthesia Post Note  Patient: Kelli Perkins  Procedure(s) Performed: AN AD HOC LABOR EPIDURAL     Patient location during evaluation: Mother Baby Anesthesia Type: Epidural Level of consciousness: awake and alert Pain management: pain level controlled Vital Signs Assessment: post-procedure vital signs reviewed and stable Respiratory status: spontaneous breathing Cardiovascular status: stable Postop Assessment: no headache, adequate PO intake, no backache, patient able to bend at knees, able to ambulate, epidural receding and no apparent nausea or vomiting Anesthetic complications: no    Last Vitals:  Vitals:   07/11/19 2339 07/12/19 0540  BP: 118/64 129/80  Pulse:  82  Resp: 18 18  Temp: 36.7 C 36.7 C  SpO2: 100% 97%    Last Pain:  Vitals:   07/12/19 0806  TempSrc:   PainSc: 0-No pain   Pain Goal:                Epidural/Spinal Function Cutaneous sensation: Normal sensation (07/12/19 0806)  Salome Arnt

## 2019-07-12 NOTE — Lactation Note (Addendum)
This note was copied from a baby's chart. Lactation Consultation Note Baby 11 hrs old. Mom stated baby had good BF but sleeping now. Mom holding baby swaddled. Discussed when feeding baby do STS as much as possible. Taught hand expression w/colostrum noted. Mom excited to see it. Newborn behavior, I&O, breast massage, feeding habits, supply and demand discussed. Mom encouraged to feed baby 8-12 times/24 hours and with feeding cues. Mom encouraged to waken baby for feeding if hasn't cued in 3 hrs. Noted pacifier in bassinet, discouraged and explained why. Mom feels feedings are going well at this time. Encouraged mom to rest while baby is resting. D/t baby being 37 weeks LC gave mom LPI information sheet. Encouraged to call for questions or concerns. Lactation brochure given.  Patient Name: Kelli Perkins [id number] Date: 07/12/2019 Reason for consult: Initial assessment;Primapara;Early term 37-38.6wks   Maternal Data Has patient been taught Hand Expression?: Yes Does the patient have breastfeeding experience prior to this delivery?: No  Feeding    LATCH Score       Type of Nipple: Everted at rest and after stimulation  Comfort (Breast/Nipple): Soft / non-tender        Interventions Interventions: Breast feeding basics reviewed;Skin to skin;Breast massage;Hand express;Breast compression  Lactation Tools Discussed/Used WIC Program: No   Consult Status Consult Status: Follow-up Date: 07/12/19 Follow-up type: In-patient    Antanisha Mohs, Diamond Nickel 07/12/2019, 1:11 AM

## 2019-07-12 NOTE — Progress Notes (Signed)
Post Partum Day 1, SVD at 37.6 wks, A1GDM BOY-- breast feeding. Circ planned (Dr Billy Coast).   Subjective: no complaints, up ad lib, voiding, tolerating PO and + flatus  Some perineal pressure/ pain and hemorrhoids, Lochia moderate  Breast feeding  Objective: Blood pressure 129/80, pulse 82, temperature 98.1 F (36.7 C), temperature source Oral, resp. rate 18, height 5\' 6"  (1.676 m), weight 100.7 kg, last menstrual period 09/21/2018, SpO2 97 %, unknown if currently breastfeeding.  Physical Exam:  General: alert and cooperative  Lungs CTA CV RRR Lochia: appropriate Uterine Fundus: firm Incision: healing well DVT Evaluation: No evidence of DVT seen on physical exam.  CBC Latest Ref Rng & Units 07/12/2019 07/11/2019 07/11/2019  WBC 4.0 - 10.5 K/uL 12.5(H) 10.4 8.2  Hemoglobin 12.0 - 15.0 g/dL 09/10/2019) 8.6(H 11.6(L)  Hematocrit 36.0 - 46.0 % 31.2(L) 38.1 36.2  Platelets 150 - 400 K/uL 161 200 187    O+ Rub Imm  Assessment/Plan: PPD #1, SVD, A1GDM, well controlled.  Routine PP care Boy- circ Anticipate DC tomorrow  Lactation assistance   LOS: 1 day   68.3 07/12/2019, 10:15 AM

## 2019-07-13 MED ORDER — POLYSACCHARIDE IRON COMPLEX 150 MG PO CAPS: 150.0000 mg | ORAL_CAPSULE | Freq: Every day | ORAL | 0 refills | Status: AC

## 2019-07-13 MED ORDER — IBUPROFEN 600 MG PO TABS: 600.0000 mg | ORAL_TABLET | Freq: Four times a day (QID) | ORAL | 0 refills | Status: AC

## 2019-07-13 NOTE — Progress Notes (Signed)
Post Partum Day 2, SVD at 37.6 wks, A1GDM BOY-- breast feeding. Circ planned (Dr Billy Coast). -today  Subjective: no complaints, up ad lib, voiding, tolerating PO and + flatus  Some perineal pressure/ pain and hemorrhoids, Lochia minimal Breast feeding well   Objective: Blood pressure 120/69, pulse 81, temperature 98.2 F (36.8 C), temperature source Oral, resp. rate 14, height 5\' 6"  (1.676 m), weight 100.7 kg, last menstrual period 09/21/2018, SpO2 100 %, unknown if currently breastfeeding.  Physical Exam:  General: alert and cooperative  Lungs CTA CV RRR Lochia: appropriate Uterine Fundus: firm Incision: healing well DVT Evaluation: No evidence of DVT seen on physical exam.  CBC Latest Ref Rng & Units 07/12/2019 07/11/2019 07/11/2019  WBC 4.0 - 10.5 K/uL 12.5(H) 10.4 8.2  Hemoglobin 12.0 - 15.0 g/dL 09/10/2019) 6.7(E 11.6(L)  Hematocrit 36.0 - 46.0 % 31.2(L) 38.1 36.2  Platelets 150 - 400 K/uL 161 200 187    O+ Rub Imm  Assessment/Plan: PPD #2, SVD, A1GDM, well controlled.  Routine PP care Boy- circ today Lactation assistance Good support Discharge today- F/up with Dr 72.0 in 6 weeks and sooner as needed, warning s/s reviewed    LOS: 2 days   Billy Coast 07/13/2019, 11:33 AM

## 2019-07-13 NOTE — Discharge Summary (Addendum)
Obstetric Discharge Summary Reason for Admission: SROM, labor at 37.6 wks. A1 GDM, well controlled  Prenatal Procedures: Rescue Cerclage at 19.4 weeks, Cerclage removal in office at 36.3 wks. GDMA1, growth sonos Intrapartum Procedures: spontaneous vaginal delivery Postpartum Procedures: none Complications-Operative and Postpartum: 2nd degree perineal and cervical laceration  degree perineal laceration Hemoglobin  Date Value Ref Range Status  07/12/2019 9.9 (L) 12.0 - 15.0 g/dL Final   HCT  Date Value Ref Range Status  07/12/2019 31.2 (L) 36.0 - 46.0 % Final    Physical Exam:  General: alert and cooperative Lochia: appropriate Uterine Fundus: firm Incision: no significant drainage, no dehiscence DVT Evaluation: No evidence of DVT seen on physical exam.  Discharge Diagnoses: Term Pregnancy-delivered  Discharge Information: Date: 07/13/2019 Activity: pelvic rest Diet: routine Medications: PNV, Ibuprofen and Iron Condition: stable Instructions: refer to practice specific booklet Discharge to: home F/up Dr Billy Coast in office at 6 wks   Newborn Data: Live born female . Circumcision on 07/13/19 before discharge  Birth Weight: 7 lb 0.5 oz (3189 g) APGAR: 9, 9  Newborn Delivery   Birth date/time: 07/11/2019 13:31:00 Delivery type: Vaginal, Spontaneous    Home with mother.   Kelli Perkins 07/13/2019, 11:38 AM

## 2019-07-13 NOTE — Lactation Note (Signed)
This note was copied from a baby's chart. Lactation Consultation Note  Patient Name: Kelli Perkins [id number] Date: 07/13/2019   Mom says infant latches for about 15 minutes & then she provides formula. I talked to Mom about pumping whenever infant receives formula to help prevent engorgement, etc. Mom was agreeable.   When I did hand expression, I observed colostrum & transitional milk. Since infant is getting circumcised & will be here for a bit, I provided Mom with a hand pump. Size 24 flange is appropriate at this time. Mom was able to express 18 mL.  Mom has a Lansinoh pump at home.   I also talked to Mom/MGM about how using the teacup hold could be of assist with getting infant to latch. Mom agreeable to making an outpatient lactation appt, request for appt was sent via Epic.    Lurline Hare [facility] 07/13/2019, 10:23 AM

## 2019-07-20 ENCOUNTER — Inpatient Hospital Stay (HOSPITAL_COMMUNITY)
Admission: AD | Admit: 2019-07-20 | Payer: BC Managed Care – PPO | Source: Home / Self Care | Admitting: Obstetrics and Gynecology

## 2019-07-20 ENCOUNTER — Inpatient Hospital Stay (HOSPITAL_COMMUNITY): Payer: BC Managed Care – PPO

## 2019-07-21 DIAGNOSIS — M5489 Other dorsalgia: Secondary | ICD-10-CM | POA: Diagnosis not present

## 2019-08-25 DIAGNOSIS — B977 Papillomavirus as the cause of diseases classified elsewhere: Secondary | ICD-10-CM | POA: Diagnosis not present

## 2019-08-25 DIAGNOSIS — Z124 Encounter for screening for malignant neoplasm of cervix: Secondary | ICD-10-CM | POA: Diagnosis not present

## 2019-08-25 DIAGNOSIS — N72 Inflammatory disease of cervix uteri: Secondary | ICD-10-CM | POA: Diagnosis not present

## 2019-08-25 DIAGNOSIS — Z1151 Encounter for screening for human papillomavirus (HPV): Secondary | ICD-10-CM | POA: Diagnosis not present

## 2021-12-21 ENCOUNTER — Emergency Department (HOSPITAL_COMMUNITY)
Admission: EM | Admit: 2021-12-21 | Discharge: 2021-12-22 | Discharge status: Against Medical Advice | Payer: BC Managed Care – PPO | Attending: Emergency Medicine | Admitting: Emergency Medicine

## 2021-12-21 ENCOUNTER — Encounter (HOSPITAL_COMMUNITY): Payer: Self-pay | Admitting: Emergency Medicine

## 2021-12-21 ENCOUNTER — Other Ambulatory Visit: Payer: Self-pay

## 2021-12-21 ENCOUNTER — Emergency Department (HOSPITAL_COMMUNITY): Payer: BC Managed Care – PPO

## 2021-12-21 DIAGNOSIS — M25511 Pain in right shoulder: Secondary | ICD-10-CM | POA: Diagnosis not present

## 2021-12-21 DIAGNOSIS — M25512 Pain in left shoulder: Secondary | ICD-10-CM | POA: Diagnosis not present

## 2021-12-21 DIAGNOSIS — R200 Anesthesia of skin: Secondary | ICD-10-CM | POA: Diagnosis not present

## 2021-12-21 DIAGNOSIS — R202 Paresthesia of skin: Secondary | ICD-10-CM | POA: Diagnosis present

## 2021-12-21 DIAGNOSIS — G8929 Other chronic pain: Secondary | ICD-10-CM | POA: Insufficient documentation

## 2021-12-21 DIAGNOSIS — Z5321 Procedure and treatment not carried out due to patient leaving prior to being seen by health care provider: Secondary | ICD-10-CM | POA: Insufficient documentation

## 2021-12-21 NOTE — ED Provider Triage Note (Signed)
Emergency Medicine Provider Triage Evaluation Note  Kelli Perkins , a 31 y.o. female  was evaluated in triage.  Pt complains of bilateral hand numbness.  Intermittent.  No current hand numbnessor weakness.,  Chronic shoulder pain bilaterally.  No other neurodeficits.  Review of Systems  Positive: Intermittent paresthesia Negative: Weakness  Physical Exam  BP (!) 141/98 (BP Location: Right Arm)   Pulse 79   Temp 98.3 F (36.8 C) (Oral)   Resp 16   LMP 11/25/2021   SpO2 98%  Gen:   Awake, no distress   Resp:  Normal effort  MSK:   Moves extremities without difficulty  Other:  Full grip strength, negative Tinel's  Medical Decision Making  Medically screening exam initiated at 11:19 PM.  Appropriate orders placed.  Kelli Perkins was informed that the remainder of the evaluation will be completed by another provider, this initial triage assessment does not replace that evaluation, and the importance of remaining in the ED until their evaluation is complete.  Work-up initiated   Arthor Captain, PA-C 12/21/21 2320

## 2021-12-21 NOTE — ED Triage Notes (Signed)
Pt c/o intermittent tingling to both hands. No tingling at this time. Tingling typically occurs while she is working.

## 2021-12-21 NOTE — ED Notes (Signed)
Patient states the wait is long and she has to leave

## 2021-12-22 ENCOUNTER — Other Ambulatory Visit: Payer: Self-pay

## 2021-12-22 ENCOUNTER — Emergency Department (HOSPITAL_BASED_OUTPATIENT_CLINIC_OR_DEPARTMENT_OTHER)
Admission: EM | Admit: 2021-12-22 | Discharge: 2021-12-22 | Disposition: A | Discharge status: Home / Self Care | Payer: BC Managed Care – PPO | Source: Home / Self Care | Attending: Emergency Medicine | Admitting: Emergency Medicine

## 2021-12-22 ENCOUNTER — Encounter (HOSPITAL_BASED_OUTPATIENT_CLINIC_OR_DEPARTMENT_OTHER): Payer: Self-pay | Admitting: Emergency Medicine

## 2021-12-22 DIAGNOSIS — R202 Paresthesia of skin: Secondary | ICD-10-CM

## 2021-12-22 LAB — BASIC METABOLIC PANEL
Anion gap: 9 (ref 5–15)
BUN: 9 mg/dL (ref 6–20)
CO2: 25 mmol/L (ref 22–32)
Calcium: 9.4 mg/dL (ref 8.9–10.3)
Chloride: 102 mmol/L (ref 98–111)
Creatinine, Ser: 0.73 mg/dL (ref 0.44–1.00)
GFR, Estimated: >60 mL/min (ref >60)
Glucose, Bld: 105 mg/dL — ABNORMAL HIGH (ref 70–99)
Potassium: 3.8 mmol/L (ref 3.5–5.1)
Sodium: 136 mmol/L (ref 135–145)

## 2021-12-22 LAB — CBC
HCT: 37.2 % (ref 36.0–46.0)
Hemoglobin: 12 g/dL (ref 12.0–15.0)
MCH: 27.6 pg (ref 26.0–34.0)
MCHC: 32.3 g/dL (ref 30.0–36.0)
MCV: 85.5 fL (ref 80.0–100.0)
Platelets: 270 10*3/uL (ref 150–400)
RBC: 4.35 MIL/uL (ref 3.87–5.11)
RDW: 13.7 % (ref 11.5–15.5)
WBC: 6.6 10*3/uL (ref 4.0–10.5)
nRBC: 0 % (ref 0.0–0.2)

## 2021-12-22 LAB — HEMOGLOBIN A1C
Hgb A1c MFr Bld: 6 % — ABNORMAL HIGH (ref 4.8–5.6)
Mean Plasma Glucose: 125.5 mg/dL

## 2021-12-22 NOTE — ED Triage Notes (Signed)
Pt here from home with c/o tingling to both  and hand and feet off and on since Monday

## 2021-12-22 NOTE — ED Provider Notes (Signed)
[facility] EMERGENCY DEPT Provider Note   CSN: [id number] Arrival date & time: 12/22/21  0906     History Chief Complaint  Patient presents with   Tingling    HPI Kelli Perkins is a 31 y.o. female presenting for intermittent hand and finger tingling.  She states that she had a episode on Monday at work where she got overheated and started feeling her heart race.  It was associated with bilateral finger tingling.  She states that since then she has intermittently had episodes of finger tingling.  She is currently asymptomatic.  She denies dizziness.  She is ambulatory tolerating p.o. intake.  She has a child at bedside and states that she has not been struggling to provide care for him.  She denies fevers or chills nausea or vomiting, syncope or shortness of breath..   Patient's recorded medical, surgical, social, medication list and allergies were reviewed in the Snapshot window as part of the initial history.   Review of Systems   Review of Systems  Constitutional:  Negative for chills and fever.  HENT:  Negative for ear pain and sore throat.   Eyes:  Negative for pain and visual disturbance.  Respiratory:  Negative for cough and shortness of breath.   Cardiovascular:  Negative for chest pain and palpitations.  Gastrointestinal:  Negative for abdominal pain and vomiting.  Genitourinary:  Negative for dysuria and hematuria.  Musculoskeletal:  Negative for arthralgias and back pain.  Skin:  Negative for color change and rash.  Neurological:  Negative for seizures and syncope.  All other systems reviewed and are negative.   Physical Exam Updated Vital Signs BP 129/87   Pulse 68   Temp 99.5 F (37.5 C) (Oral)   Resp 17   Ht 5\' 7"  (1.702 m)   Wt 90.7 kg   LMP 11/25/2021   SpO2 100%   BMI 31.32 kg/m  Physical Exam Vitals and nursing note reviewed.  Constitutional:      General: She is not in acute distress.    Appearance: She is well-developed.  HENT:      Head: Normocephalic and atraumatic.  Eyes:     Conjunctiva/sclera: Conjunctivae normal.  Cardiovascular:     Rate and Rhythm: Normal rate and regular rhythm.     Heart sounds: No murmur heard. Pulmonary:     Effort: Pulmonary effort is normal. No respiratory distress.     Breath sounds: Normal breath sounds.  Abdominal:     General: There is no distension.     Palpations: Abdomen is soft.     Tenderness: There is no abdominal tenderness. There is no right CVA tenderness or left CVA tenderness.  Musculoskeletal:        General: No swelling or tenderness. Normal range of motion.     Cervical back: Neck supple.  Skin:    General: Skin is warm and dry.  Neurological:     General: No focal deficit present.     Mental Status: She is alert and oriented to person, place, and time. Mental status is at baseline.     Cranial Nerves: No cranial nerve deficit.      ED Course/ Medical Decision Making/ A&P    Procedures Procedures   Medications Ordered in ED Medications - No data to display  Medical Decision Making:    Kelli Perkins is a 31 y.o. female who presented to the ED today with nonspecific finger tingling detailed above.     Patient's presentation is  complicated by their history of peripartum hyperglycemia.  Patient placed on continuous vitals and telemetry monitoring while in ED which was reviewed periodically.   Complete initial physical exam performed, notably the patient  was hemodynamically stable in no acute distress.  She is currently asymptomatic.  She is ambulatory tolerating p.o. intake with no focal neurologic deficits..      Reviewed and confirmed nursing documentation for past medical history, family history, social history.    Initial Assessment:   With the patient's presentation of intermittent paresthesias, most likely diagnosis is nonspecific paresthesias versus possible Raynaud's phenomena given patient's description.  Alternatively this may have been  caused by an episode of heat exhaustion. Other diagnoses were considered including (but not limited to) DVT, CVA, metabolic disturbance, developing neuropathy in the setting of history of diabetes. These are considered less likely due to history of present illness and physical exam findings.  Additionally we considered cardiac etiology of patient's symptoms including ACS, pulmonary embolism, cardiogenic arrhythmia. This is most consistent with an acute complicated illness  Initial Plan:  Screening labs including CBC and Metabolic panel to evaluate for infectious or metabolic etiology of disease.  We will order an A1c though this was to be sent off to alternative lab for results EKG to evaluate for arrhythmia genic etiology of patient's symptoms.  EKG grossly reassuring no evidence of ST elevations or cardiogenic etiology of syncope. Objective evaluation as below reviewed with plan for close reassessment  Initial Study Results:   Laboratory  All laboratory results reviewed without evidence of clinically relevant pathology.    Final Assessment and Plan:   On repeat assessment after 2 hours in the emergency department, patient appears clinically well.  She remains ambulatory tolerating p.o. intake.  No evidence of acute abnormality though patient's fasting blood sugar was elevated.  This will require follow-up with a PCP.  Will provide patient phone number to schedule PCP follow-up.  Patient discharged with no further acute events.    Clinical Impression:  1. Tingling      Discharge   Final Clinical Impression(s) / ED Diagnoses Final diagnoses:  Tingling    Rx / DC Orders ED Discharge Orders     None         Glyn Ade, MD 12/22/21 1117

## 2024-03-21 ENCOUNTER — Emergency Department (HOSPITAL_BASED_OUTPATIENT_CLINIC_OR_DEPARTMENT_OTHER)
Admission: EM | Admit: 2024-03-21 | Discharge: 2024-03-21 | Disposition: A | Discharge status: Home / Self Care | Attending: Emergency Medicine | Admitting: Emergency Medicine

## 2024-03-21 ENCOUNTER — Encounter (HOSPITAL_BASED_OUTPATIENT_CLINIC_OR_DEPARTMENT_OTHER): Payer: Self-pay

## 2024-03-21 ENCOUNTER — Other Ambulatory Visit: Payer: Self-pay

## 2024-03-21 DIAGNOSIS — R1030 Lower abdominal pain, unspecified: Secondary | ICD-10-CM | POA: Diagnosis not present

## 2024-03-21 DIAGNOSIS — Z79899 Other long term (current) drug therapy: Secondary | ICD-10-CM | POA: Insufficient documentation

## 2024-03-21 DIAGNOSIS — N938 Other specified abnormal uterine and vaginal bleeding: Secondary | ICD-10-CM | POA: Diagnosis present

## 2024-03-21 LAB — PREGNANCY, URINE: Preg Test, Ur: NEGATIVE

## 2024-03-21 NOTE — ED Triage Notes (Signed)
 Pt c/o spotting x like 2 weeks, started out lighter but now it's getting heavier. Associated abd pain, some upper, some lower. Denies pregnancy. Denies feeling lightheaded/ dizzy, states biggest concern is continuous spotting  LMP 10/28, onset of spotting approx 11/9

## 2024-03-21 NOTE — ED Provider Notes (Signed)
  EMERGENCY DEPARTMENT AT [facility] Provider Note   CSN: [id number] Arrival date & time: 03/21/24  1659     Patient presents with: Abdominal Pain and Vaginal Bleeding   ASAIAH HUNNICUTT is a 33 y.o. female.  With a history of cervical insufficiency status post cerclage G2, P1 A1 who presents to the ED for vaginal bleeding.  Last menstrual period ended on November 1.  Over the last couple days has had some light spotting.  Some lower abdominal discomfort.  No nausea vomiting urinary symptoms.  No fevers or chills.  Has an upcoming OB/GYN appointment on December 5.    Abdominal Pain Associated symptoms: vaginal bleeding   Vaginal Bleeding Associated symptoms: abdominal pain        Prior to Admission medications   Medication Sig Start Date End Date Taking? Authorizing Provider  acetaminophen  (TYLENOL ) 325 MG tablet Take 650 mg by mouth every 6 (six) hours as needed for mild pain, moderate pain or headache.     [provider]  calcium carbonate (TUMS - DOSED IN MG ELEMENTAL CALCIUM) 500 MG chewable tablet Chew 1 tablet by mouth daily as needed for indigestion or heartburn.     [provider]  ibuprofen  (ADVIL ) 600 MG tablet Take 1 tablet (600 mg total) by mouth every 6 (six) hours. 07/13/19   Barbette Knock, MD  iron  polysaccharides (NIFEREX) 150 MG capsule Take 1 capsule (150 mg total) by mouth daily for 30 doses. 07/13/19 08/12/19  Barbette Knock, MD  Prenatal Vit-Fe Fumarate-FA (PRENATAL MULTIVITAMIN) TABS tablet Take 1 tablet by mouth daily.     [provider]    Allergies: Patient has no known allergies.    Review of Systems  Gastrointestinal:  Positive for abdominal pain.  Genitourinary:  Positive for vaginal bleeding.    Updated Vital Signs BP (!) 144/84   Pulse (!) 115   Temp 98.7 F (37.1 C)   Resp 16   SpO2 100%   Physical Exam Vitals and nursing note reviewed.  HENT:     Head: Normocephalic and atraumatic.  Eyes:      Pupils: Pupils are equal, round, and reactive to light.  Cardiovascular:     Rate and Rhythm: Normal rate and regular rhythm.  Pulmonary:     Effort: Pulmonary effort is normal.     Breath sounds: Normal breath sounds.  Abdominal:     Palpations: Abdomen is soft.     Tenderness: There is no abdominal tenderness. There is no guarding or rebound.  Skin:    General: Skin is warm and dry.  Neurological:     Mental Status: She is alert.  Psychiatric:        Mood and Affect: Mood normal.     (all labs ordered are listed, but only abnormal results are displayed) Labs Reviewed  PREGNANCY, URINE    EKG: None  Radiology: No results found.   Procedures   Medications Ordered in the ED - No data to display                                  Medical Decision Making 33 year old female with history as above presenting for vaginal spotting.  Some light bleeding going on over the last week or 2 intermittently.  Benign soft abdomen no tenderness on my exam.  Pregnancy test negative.  Doubt ectopic pregnancy or ovarian torsion.  May be related to fibroid which  her mother has a history of.  No concerning features that would warrant ultrasound imaging or blood work at this time.  She has a close follow-up with OB.  Stable for discharge with return precautions discussed in detail.  Amount and/or Complexity of Data Reviewed Labs: ordered.        Final diagnoses:  Dysfunctional uterine bleeding    ED Discharge Orders     None          Pamella Ozell LABOR, DO 03/21/24 1757

## 2024-03-21 NOTE — Discharge Instructions (Signed)
 You were seen in the emerged department for vaginal spotting Your pregnancy test is negative You need to follow-up with your OB as scheduled in early December Return to the Emergency Department for severe pain persistent heavy bleeding or any other concerns

## 2024-03-21 NOTE — ED Notes (Signed)
 Reviewed AVS/discharge instruction with patient. Time allotted for and all questions answered. Patient is agreeable for d/c and escorted to ed exit by staff.
